# Patient Record
Sex: Female | Born: 1990 | Race: Black or African American | Hispanic: No | Marital: Single | Smoking: Former smoker
Health system: Southern US, Community
[De-identification: ages and names within clinical notes are randomized; demographics above are authoritative.]

## PROBLEM LIST (undated history)

## (undated) ENCOUNTER — Inpatient Hospital Stay (HOSPITAL_COMMUNITY): Payer: Self-pay

## (undated) DIAGNOSIS — R7303 Prediabetes: Secondary | ICD-10-CM

## (undated) DIAGNOSIS — N939 Abnormal uterine and vaginal bleeding, unspecified: Secondary | ICD-10-CM

## (undated) DIAGNOSIS — G932 Benign intracranial hypertension: Secondary | ICD-10-CM

## (undated) DIAGNOSIS — G43009 Migraine without aura, not intractable, without status migrainosus: Secondary | ICD-10-CM

## (undated) DIAGNOSIS — R51 Headache: Secondary | ICD-10-CM

## (undated) DIAGNOSIS — Z8619 Personal history of other infectious and parasitic diseases: Secondary | ICD-10-CM

## (undated) DIAGNOSIS — Z8279 Family history of other congenital malformations, deformations and chromosomal abnormalities: Secondary | ICD-10-CM

## (undated) DIAGNOSIS — R635 Abnormal weight gain: Secondary | ICD-10-CM

## (undated) DIAGNOSIS — Z8489 Family history of other specified conditions: Secondary | ICD-10-CM

## (undated) HISTORY — DX: Family history of other congenital malformations, deformations and chromosomal abnormalities: Z82.79

## (undated) HISTORY — PX: NO PAST SURGERIES: SHX2092

## (undated) HISTORY — DX: Benign intracranial hypertension: G93.2

## (undated) HISTORY — DX: Abnormal weight gain: R63.5

## (undated) HISTORY — DX: Headache: R51

---
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
## 2009-04-16 ENCOUNTER — Emergency Department (HOSPITAL_COMMUNITY): Admission: EM | Admit: 2009-04-16 | Discharge: 2009-04-17 | Payer: Self-pay | Admitting: Emergency Medicine

## 2009-07-27 DIAGNOSIS — G932 Benign intracranial hypertension: Secondary | ICD-10-CM

## 2009-07-27 HISTORY — DX: Benign intracranial hypertension: G93.2

## 2010-06-27 ENCOUNTER — Ambulatory Visit (HOSPITAL_COMMUNITY): Admission: RE | Admit: 2010-06-27 | Discharge: 2010-06-27 | Payer: Self-pay | Admitting: Neurology

## 2010-07-02 ENCOUNTER — Encounter: Admission: RE | Admit: 2010-07-02 | Discharge: 2010-07-02 | Payer: Self-pay | Admitting: Neurology

## 2011-01-09 LAB — CSF CELL COUNT WITH DIFFERENTIAL
Eosinophils, CSF: 0 % (ref 0–1)
Other Cells, CSF: 0

## 2011-01-09 LAB — PROTEIN AND GLUCOSE, CSF: Total  Protein, CSF: 16 mg/dL (ref 15–45)

## 2011-02-03 LAB — CSF CELL COUNT WITH DIFFERENTIAL
RBC Count, CSF: 0 /mm3
Tube #: 3
WBC, CSF: 1 /mm3 (ref 0–5)

## 2011-02-03 LAB — CSF CULTURE W GRAM STAIN
Culture: NO GROWTH
Gram Stain: NONE SEEN

## 2011-02-03 LAB — PROTEIN, CSF: Total  Protein, CSF: 12 mg/dL — ABNORMAL LOW (ref 15–45)

## 2011-02-03 LAB — GLUCOSE, CSF: Glucose, CSF: 63 mg/dL (ref 43–76)

## 2011-10-28 NOTE — L&D Delivery Note (Signed)
Delivery Note  I was notified of pt SROM w thick MSAF at 1235, mild early variables were present that resolved w position changes for a short time, early variables returned and Pt was then ant. Lip at 1345, FHR overall reassuring    I arrived to [facility] and pt pushed x1  At 2:20 PM a viable female was delivered via Vaginal, Spontaneous Delivery (Presentation: ; Occiput ).  APGAR: 8, 9; weight 6 lb 8 oz (2948 g).   Placenta status: Intact, Spontaneous.  Cord: 3 vessels with the following complications: None.  Cord pH: n/a NICU team was called, but infant was immediately vigorous, so team not needed   Anesthesia: Epidural  Episiotomy: None Lacerations: 1st degree;Labial Suture Repair: 3.0 vicryl rapide Est. Blood Loss (mL): 200  Mom to postpartum.  Baby to nursery-stable.  Daisy Werner 10/05/2012, 3:12 PM (late entry)

## 2012-03-17 ENCOUNTER — Ambulatory Visit (INDEPENDENT_AMBULATORY_CARE_PROVIDER_SITE_OTHER): Payer: BC Managed Care – PPO | Admitting: Obstetrics and Gynecology

## 2012-03-17 DIAGNOSIS — Z331 Pregnant state, incidental: Secondary | ICD-10-CM

## 2012-03-17 LAB — POCT URINALYSIS DIPSTICK
Bilirubin, UA: NEGATIVE
Blood, UA: NEGATIVE
Glucose, UA: NEGATIVE
Leukocytes, UA: NEGATIVE
Nitrite, UA: NEGATIVE
Urobilinogen, UA: NEGATIVE

## 2012-03-18 LAB — PRENATAL PANEL VII
Antibody Screen: NEGATIVE
Basophils Absolute: 0 10*3/uL (ref 0.0–0.1)
Eosinophils Relative: 4 % (ref 0–5)
HCT: 32.2 % — ABNORMAL LOW (ref 36.0–46.0)
HIV: NONREACTIVE
Hemoglobin: 10.6 g/dL — ABNORMAL LOW (ref 12.0–15.0)
Lymphocytes Relative: 14 % (ref 12–46)
Lymphs Abs: 1.4 10*3/uL (ref 0.7–4.0)
MCV: 85.9 fL (ref 78.0–100.0)
Monocytes Absolute: 1 10*3/uL (ref 0.1–1.0)
Monocytes Relative: 9 % (ref 3–12)
Neutro Abs: 7.4 10*3/uL (ref 1.7–7.7)
RDW: 13.2 % (ref 11.5–15.5)
Rh Type: POSITIVE
Rubella: 133.9 IU/mL — ABNORMAL HIGH
WBC: 10.2 10*3/uL (ref 4.0–10.5)

## 2012-03-19 LAB — CULTURE, OB URINE
Colony Count: NO GROWTH
Organism ID, Bacteria: NO GROWTH

## 2012-03-19 LAB — HEMOGLOBINOPATHY EVALUATION: Hgb F Quant: 0 % (ref 0.0–2.0)

## 2012-03-23 DIAGNOSIS — O99019 Anemia complicating pregnancy, unspecified trimester: Secondary | ICD-10-CM | POA: Insufficient documentation

## 2012-03-24 ENCOUNTER — Telehealth: Payer: Self-pay | Admitting: Obstetrics and Gynecology

## 2012-03-24 NOTE — Telephone Encounter (Signed)
Triage/epic 

## 2012-03-25 NOTE — Telephone Encounter (Signed)
Spoke with pt regarding 1st trimester screening. Patient wants 1st trimester screening. Informed pt to keep appt tomorrow & HS will order and schedule 1st trimester for her. Pt agrees and understands.

## 2012-03-25 NOTE — Telephone Encounter (Signed)
Lm for pt who wants 1st tri screening

## 2012-03-26 ENCOUNTER — Ambulatory Visit (INDEPENDENT_AMBULATORY_CARE_PROVIDER_SITE_OTHER): Payer: BC Managed Care – PPO

## 2012-03-26 VITALS — BP 130/54 | Ht 65.0 in | Wt 190.0 lb

## 2012-03-26 DIAGNOSIS — E669 Obesity, unspecified: Secondary | ICD-10-CM

## 2012-03-26 DIAGNOSIS — Z124 Encounter for screening for malignant neoplasm of cervix: Secondary | ICD-10-CM

## 2012-03-26 DIAGNOSIS — Z3400 Encounter for supervision of normal first pregnancy, unspecified trimester: Secondary | ICD-10-CM

## 2012-03-26 DIAGNOSIS — Z3401 Encounter for supervision of normal first pregnancy, first trimester: Secondary | ICD-10-CM

## 2012-03-26 DIAGNOSIS — Z8279 Family history of other congenital malformations, deformations and chromosomal abnormalities: Secondary | ICD-10-CM

## 2012-03-26 DIAGNOSIS — Z3480 Encounter for supervision of other normal pregnancy, unspecified trimester: Secondary | ICD-10-CM

## 2012-03-26 DIAGNOSIS — Z8619 Personal history of other infectious and parasitic diseases: Secondary | ICD-10-CM

## 2012-03-26 HISTORY — DX: Family history of other congenital malformations, deformations and chromosomal abnormalities: Z82.79

## 2012-03-26 NOTE — Progress Notes (Signed)
No concerns today  Last pap: 09/2010-WNL per pt Pt states grandmother just past Wants gentic testing

## 2012-03-26 NOTE — Patient Instructions (Signed)
ABCs of Pregnancy A Antepartum care is very important. Be sure you see your doctor and get prenatal care as soon as you think you are pregnant. At this time, you will be tested for infection, genetic abnormalities and potential problems with you and the pregnancy. This is the time to discuss diet, exercise, work, medications, labor, pain medication during labor and the possibility of a cesarean delivery. Ask any questions that may concern you. It is important to see your doctor regularly throughout your pregnancy. Avoid exposure to toxic substances and chemicals - such as cleaning solvents, lead and mercury, some insecticides, and paint. Pregnant women should avoid exposure to paint fumes, and fumes that cause you to feel ill, dizzy or faint. When possible, it is a good idea to have a pre-pregnancy consultation with your caregiver to begin some important recommendations your caregiver suggests such as, taking folic acid, exercising, quitting smoking, avoiding alcoholic beverages, etc. B Breastfeeding is the healthiest choice for both you and your baby. It has many nutritional benefits for the baby and health benefits for the mother. It also creates a very tight and loving bond between the baby and mother. Talk to your doctor, your family and friends, and your employer about how you choose to feed your baby and how they can support you in your decision. Not all birth defects can be prevented, but a woman can take actions that may increase her chance of having a healthy baby. Many birth defects happen very early in pregnancy, sometimes before a woman even knows she is pregnant. Birth defects or abnormalities of any child in your or the father's family should be discussed with your caregiver. Get a good support bra as your breast size changes. Wear it especially when you exercise and when nursing.  C Celebrate the news of your pregnancy with the your spouse/father and family. Childbirth classes are helpful to  take for you and the spouse/father because it helps to understand what happens during the pregnancy, labor and delivery. Cesarean delivery should be discussed with your doctor so you are prepared for that possibility. The pros and cons of circumcision if it is a boy, should be discussed with your pediatrician. Cigarette smoking during pregnancy can result in low birth weight babies. It has been associated with infertility, miscarriages, tubal pregnancies, infant death (mortality) and poor health (morbidity) in childhood. Additionally, cigarette smoking may cause long-term learning disabilities. If you smoke, you should try to quit before getting pregnant and not smoke during the pregnancy. Secondary smoke may also harm a mother and her developing baby. It is a good idea to ask people to stop smoking around you during your pregnancy and after the baby is born. Extra calcium is necessary when you are pregnant and is found in your prenatal vitamin, in dairy products, green leafy vegetables and in calcium supplements. D A healthy diet according to your current weight and height, along with vitamins and mineral supplements should be discussed with your caregiver. Domestic abuse or violence should be made known to your doctor right away to get the situation corrected. Drink more water when you exercise to keep hydrated. Discomfort of your back and legs usually develops and progresses from the middle of the second trimester through to delivery of the baby. This is because of the enlarging baby and uterus, which may also affect your balance. Do not take illegal drugs. Illegal drugs can seriously harm the baby and you. Drink extra fluids (water is best) throughout pregnancy to help   your body keep up with the increases in your blood volume. Drink at least 6 to 8 glasses of water, fruit juice, or milk each day. A good way to know you are drinking enough fluid is when your urine looks almost like clear water or is very light  yellow.  E Eat healthy to get the nutrients you and your unborn baby need. Your meals should include the five basic food groups. Exercise (30 minutes of light to moderate exercise a day) is important and encouraged during pregnancy, if there are no medical problems or problems with the pregnancy. Exercise that causes discomfort or dizziness should be stopped and reported to your caregiver. Emotions during pregnancy can change from being ecstatic to depression and should be understood by you, your partner and your family. F Fetal screening with ultrasound, amniocentesis and monitoring during pregnancy and labor is common and sometimes necessary. Take 400 micrograms of folic acid daily both before, when possible, and during the first few months of pregnancy to reduce the risk of birth defects of the brain and spine. All women who could possibly become pregnant should take a vitamin with folic acid, every day. It is also important to eat a healthy diet with fortified foods (enriched grain products, including cereals, rice, breads, and pastas) and foods with natural sources of folate (orange juice, green leafy vegetables, beans, peanuts, broccoli, asparagus, peas, and lentils). The father should be involved with all aspects of the pregnancy including, the prenatal care, childbirth classes, labor, delivery, and postpartum time. Fathers may also have emotional concerns about being a father, financial needs, and raising a family. G Genetic testing should be done appropriately. It is important to know your family and the father's history. If there have been problems with pregnancies or birth defects in your family, report these to your doctor. Also, genetic counselors can talk with you about the information you might need in making decisions about having a family. You can call a major medical center in your area for help in finding a board-certified genetic counselor. Genetic testing and counseling should be done  before pregnancy when possible, especially if there is a history of problems in the mother's or father's family. Certain ethnic backgrounds are more at risk for genetic defects. H Get familiar with the hospital where you will be having your baby. Get to know how long it takes to get there, the labor and delivery area, and the hospital procedures. Be sure your medical insurance is accepted there. Get your home ready for the baby including, clothes, the baby's room (when possible), furniture and car seat. Hand washing is important throughout the day, especially after handling raw meat and poultry, changing the baby's diaper or using the bathroom. This can help prevent the spread of many bacteria and viruses that cause infection. Your hair may become dry and thinner, but will return to normal a few weeks after the baby is born. Heartburn is a common problem that can be treated by taking antacids recommended by your caregiver, eating smaller meals 5 or 6 times a day, not drinking liquids when eating, drinking between meals and raising the head of your bed 2 to 3 inches. I Insurance to cover you, the baby, doctor and hospital should be reviewed so that you will be prepared to pay any costs not covered by your insurance plan. If you do not have medical insurance, there are usually clinics and services available for you in your community. Take 30 milligrams of iron during   your pregnancy as prescribed by your doctor to reduce the risk of low red blood cells (anemia) later in pregnancy. All women of childbearing age should eat a diet rich in iron. J There should be a joint effort for the mother, father and any other children to adapt to the pregnancy financially, emotionally, and psychologically during the pregnancy. Join a support group for moms-to-be. Or, join a class on parenting or childbirth. Have the family participate when possible. K Know your limits. Let your caregiver know if you experience any of the  following:   Pain of any kind.   Strong cramps.   You develop a lot of weight in a short period of time (5 pounds in 3 to 5 days).   Vaginal bleeding, leaking of amniotic fluid.   Headache, vision problems.   Dizziness, fainting, shortness of breath.   Chest pain.   Fever of 102 F (38.9 C) or higher.   Gush of clear fluid from your vagina.   Painful urination.   Domestic violence.   Irregular heartbeat (palpitations).   Rapid beating of the heart (tachycardia).   Constant feeling sick to your stomach (nauseous) and vomiting.   Trouble walking, fluid retention (edema).   Muscle weakness.   If your baby has decreased activity.   Persistent diarrhea.   Abnormal vaginal discharge.   Uterine contractions at 20-minute intervals.   Back pain that travels down your leg.  L Learn and practice that what you eat and drink should be in moderation and healthy for you and your baby. Legal drugs such as alcohol and caffeine are important issues for pregnant women. There is no safe amount of alcohol a woman can drink while pregnant. Fetal alcohol syndrome, a disorder characterized by growth retardation, facial abnormalities, and central nervous system dysfunction, is caused by a woman's use of alcohol during pregnancy. Caffeine, found in tea, coffee, soft drinks and chocolate, should also be limited. Be sure to read labels when trying to cut down on caffeine during pregnancy. More than 200 foods, beverages, and over-the-counter medications contain caffeine and have a high salt content! There are coffees and teas that do not contain caffeine. M Medical conditions such as diabetes, epilepsy, and high blood pressure should be treated and kept under control before pregnancy when possible, but especially during pregnancy. Ask your caregiver about any medications that may need to be changed or adjusted during pregnancy. If you are currently taking any medications, ask your caregiver if it  is safe to take them while you are pregnant or before getting pregnant when possible. Also, be sure to discuss any herbs or vitamins you are taking. They are medicines, too! Discuss with your doctor all medications, prescribed and over-the-counter, that you are taking. During your prenatal visit, discuss the medications your doctor may give you during labor and delivery. N Never be afraid to ask your doctor or caregiver questions about your health, the progress of the pregnancy, family problems, stressful situations, and recommendation for a pediatrician, if you do not have one. It is better to take all precautions and discuss any questions or concerns you may have during your office visits. It is a good idea to write down your questions before you visit the doctor. O Over-the-counter cough and cold remedies may contain alcohol or other ingredients that should be avoided during pregnancy. Ask your caregiver about prescription, herbs or over-the-counter medications that you are taking or may consider taking while pregnant.  P Physical activity during pregnancy can   benefit both you and your baby by lessening discomfort and fatigue, providing a sense of well-being, and increasing the likelihood of early recovery after delivery. Light to moderate exercise during pregnancy strengthens the belly (abdominal) and back muscles. This helps improve posture. Practicing yoga, walking, swimming, and cycling on a stationary bicycle are usually safe exercises for pregnant women. Avoid scuba diving, exercise at high altitudes (over 3000 feet), skiing, horseback riding, contact sports, etc. Always check with your doctor before beginning any kind of exercise, especially during pregnancy and especially if you did not exercise before getting pregnant. Q Queasiness, stomach upset and morning sickness are common during pregnancy. Eating a couple of crackers or dry toast before getting out of bed. Foods that you normally love may  make you feel sick to your stomach. You may need to substitute other nutritious foods. Eating 5 or 6 small meals a day instead of 3 large ones may make you feel better. Do not drink with your meals, drink between meals. Questions that you have should be written down and asked during your prenatal visits. R Read about and make plans to baby-proof your home. There are important tips for making your home a safer environment for your baby. Review the tips and make your home safer for you and your baby. Read food labels regarding calories, salt and fat content in the food. S Saunas, hot tubs, and steam rooms should be avoided while you are pregnant. Excessive high heat may be harmful during your pregnancy. Your caregiver will screen and examine you for sexually transmitted diseases and genetic disorders during your prenatal visits. Learn the signs of labor. Sexual relations while pregnant is safe unless there is a medical or pregnancy problem and your caregiver advises against it. T Traveling long distances should be avoided especially in the third trimester of your pregnancy. If you do have to travel out of state, be sure to take a copy of your medical records and medical insurance plan with you. You should not travel long distances without seeing your doctor first. Most airlines will not allow you to travel after 36 weeks of pregnancy. Toxoplasmosis is an infection caused by a parasite that can seriously harm an unborn baby. Avoid eating undercooked meat and handling cat litter. Be sure to wear gloves when gardening. Tingling of the hands and fingers is not unusual and is due to fluid retention. This will go away after the baby is born. U Womb (uterus) size increases during the first trimester. Your kidneys will begin to function more efficiently. This may cause you to feel the need to urinate more often. You may also leak urine when sneezing, coughing or laughing. This is due to the growing uterus pressing  against your bladder, which lies directly in front of and slightly under the uterus during the first few months of pregnancy. If you experience burning along with frequency of urination or bloody urine, be sure to tell your doctor. The size of your uterus in the third trimester may cause a problem with your balance. It is advisable to maintain good posture and avoid wearing high heels during this time. An ultrasound of your baby may be necessary during your pregnancy and is safe for you and your baby. V Vaccinations are an important concern for pregnant women. Get needed vaccines before pregnancy. Center for Disease Control (www.cdc.gov) has clear guidelines for the use of vaccines during pregnancy. Review the list, be sure to discuss it with your doctor. Prenatal vitamins are helpful   and healthy for you and the baby. Do not take extra vitamins except what is recommended. Taking too much of certain vitamins can cause overdose problems. Continuous vomiting should be reported to your caregiver. Varicose veins may appear especially if there is a family history of varicose veins. They should subside after the delivery of the baby. Support hose helps if there is leg discomfort. W Being overweight or underweight during pregnancy may cause problems. Try to get within 15 pounds of your ideal weight before pregnancy. Remember, pregnancy is not a time to be dieting! Do not stop eating or start skipping meals as your weight increases. Both you and your baby need the calories and nutrition you receive from a healthy diet. Be sure to consult with your doctor about your diet. There is a formula and diet plan available depending on whether you are overweight or underweight. Your caregiver or nutritionist can help and advise you if necessary. X Avoid X-rays. If you must have dental work or diagnostic tests, tell your dentist or physician that you are pregnant so that extra care can be taken. X-rays should only be taken when  the risks of not taking them outweigh the risk of taking them. If needed, only the minimum amount of radiation should be used. When X-rays are necessary, protective lead shields should be used to cover areas of the body that are not being X-rayed. Y Your baby loves you. Breastfeeding your baby creates a loving and very close bond between the two of you. Give your baby a healthy environment to live in while you are pregnant. Infants and children require constant care and guidance. Their health and safety should be carefully watched at all times. After the baby is born, rest or take a nap when the baby is sleeping. Z Get your ZZZs. Be sure to get plenty of rest. Resting on your side as often as possible, especially on your left side is advised. It provides the best circulation to your baby and helps reduce swelling. Try taking a nap for 30 to 45 minutes in the afternoon when possible. After the baby is born rest or take a nap when the baby is sleeping. Try elevating your feet for that amount of time when possible. It helps the circulation in your legs and helps reduce swelling.  Most information courtesy of the CDC. Document Released: 10/13/2005 Document Revised: 10/02/2011 Document Reviewed: 06/27/2009 [employer] Patient Information 2012 [employer]. 

## 2012-03-30 LAB — PAP IG, CT-NG, RFX HPV ASCU: GC Probe Amp: NEGATIVE

## 2012-04-01 ENCOUNTER — Ambulatory Visit (INDEPENDENT_AMBULATORY_CARE_PROVIDER_SITE_OTHER): Payer: BC Managed Care – PPO

## 2012-04-01 ENCOUNTER — Other Ambulatory Visit: Payer: Self-pay

## 2012-04-01 ENCOUNTER — Other Ambulatory Visit: Payer: Self-pay | Admitting: Obstetrics and Gynecology

## 2012-04-01 DIAGNOSIS — Z36 Encounter for antenatal screening of mother: Secondary | ICD-10-CM

## 2012-04-13 ENCOUNTER — Telehealth: Payer: Self-pay | Admitting: Obstetrics and Gynecology

## 2012-04-13 NOTE — Telephone Encounter (Signed)
Pt states she has been feeling lightheaded, breaking out in sweats, and having blurred vision. She states this has happened about 3 times in the past week. Pt states she has not had any bleeding or change in discharge. When asked what she ate today she states that she ate a lunchable, 2 hot pockets, and a candy bar today. Pt states that she has been drinking water today as well. Offered pt multiple appointments with Dr Stefano Gaul on Thursday but pt states she is unable to come in due to her work schedule. Advised pt to drink plenty of water, eat foods high in protein, and avoid foods high in salt. Also advised pt to call back if she starts to feel worse or any other changes.

## 2012-04-13 NOTE — Telephone Encounter (Signed)
Daisy Werner/pt in epic

## 2012-04-23 ENCOUNTER — Ambulatory Visit (INDEPENDENT_AMBULATORY_CARE_PROVIDER_SITE_OTHER): Payer: BC Managed Care – PPO | Admitting: Obstetrics and Gynecology

## 2012-04-23 ENCOUNTER — Encounter: Payer: Self-pay | Admitting: Obstetrics and Gynecology

## 2012-04-23 VITALS — BP 112/64 | Wt 193.0 lb

## 2012-04-23 DIAGNOSIS — Z331 Pregnant state, incidental: Secondary | ICD-10-CM

## 2012-04-23 NOTE — Progress Notes (Signed)
Pt c/o having vertigo 4 times in 3 weeks.

## 2012-04-23 NOTE — Progress Notes (Signed)
All labs normal. Ist trimester screen normal. AFP today. Ultrasound at next visit

## 2012-04-26 LAB — ALPHA FETOPROTEIN, MATERNAL
Curr Gest Age: 16.1 wks.days
MoM for AFP: 1.13
Open Spina bifida: NEGATIVE
Osb Risk: 1:17100 {titer}

## 2012-05-13 ENCOUNTER — Ambulatory Visit (INDEPENDENT_AMBULATORY_CARE_PROVIDER_SITE_OTHER): Payer: BC Managed Care – PPO | Admitting: Obstetrics and Gynecology

## 2012-05-13 ENCOUNTER — Ambulatory Visit (INDEPENDENT_AMBULATORY_CARE_PROVIDER_SITE_OTHER): Payer: BC Managed Care – PPO

## 2012-05-13 ENCOUNTER — Other Ambulatory Visit: Payer: Self-pay | Admitting: Obstetrics and Gynecology

## 2012-05-13 VITALS — BP 112/78 | Wt 195.0 lb

## 2012-05-13 DIAGNOSIS — G932 Benign intracranial hypertension: Secondary | ICD-10-CM

## 2012-05-13 DIAGNOSIS — Z331 Pregnant state, incidental: Secondary | ICD-10-CM

## 2012-05-13 DIAGNOSIS — Z3689 Encounter for other specified antenatal screening: Secondary | ICD-10-CM

## 2012-05-13 NOTE — Progress Notes (Signed)
Doing well. First trimester screen normal. Ultrasound: Single gestation, normal fluid, normal anatomy, cervix 3.66 cm, 18 weeks and 6 days (38 percentile), female Return to office in 4 weeks Diagnosed with pseudotumor cerebri.  Only occasional headaches. Dr. Stefano Gaul

## 2012-05-13 NOTE — Progress Notes (Signed)
Pt stated no issues today.  

## 2012-05-14 LAB — US OB COMP + 14 WK

## 2012-06-10 ENCOUNTER — Ambulatory Visit (INDEPENDENT_AMBULATORY_CARE_PROVIDER_SITE_OTHER): Payer: BC Managed Care – PPO | Admitting: Obstetrics and Gynecology

## 2012-06-10 ENCOUNTER — Encounter: Payer: Self-pay | Admitting: Obstetrics and Gynecology

## 2012-06-10 VITALS — BP 110/80 | Wt 204.0 lb

## 2012-06-10 DIAGNOSIS — M549 Dorsalgia, unspecified: Secondary | ICD-10-CM

## 2012-06-10 MED ORDER — CYCLOBENZAPRINE HCL 10 MG PO TABS: 10.0000 mg | ORAL_TABLET | Freq: Three times a day (TID) | ORAL | Status: DC | PRN

## 2012-06-10 NOTE — Progress Notes (Signed)
[redacted]w[redacted]d C/o back pain today. 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 Advised sitting on a swivel stool save her back. Flexeril 10mg s Q 8hrly PRN ordered for patient. Advised that it might make her sleepy. FM+ No change in vaginal secretions. ROB x 4 weeks.

## 2012-06-10 NOTE — Progress Notes (Signed)
C/o low back pain, neck, and shoulder pain  Pt wants to know how long after vag delivery can you drive? She's asking for her sister who vag delivered on Sat.  Pt fell on buttocks c/o R side pain, +FM, no bleeding, no leaking fluids

## 2012-07-05 ENCOUNTER — Ambulatory Visit (INDEPENDENT_AMBULATORY_CARE_PROVIDER_SITE_OTHER): Payer: BC Managed Care – PPO | Admitting: Obstetrics and Gynecology

## 2012-07-05 VITALS — BP 110/72 | Wt 211.0 lb

## 2012-07-05 DIAGNOSIS — Z331 Pregnant state, incidental: Secondary | ICD-10-CM

## 2012-07-05 NOTE — Progress Notes (Signed)
[redacted]w[redacted]d C/o back pain as had been given a stool at work with no back. Letter to employer for stool with back. No other problems No change in vaginal secretions. ROB 2 weeks

## 2012-07-05 NOTE — Progress Notes (Signed)
No concerns per pt  Pt wants to come next Monday morning for 1 gtt

## 2012-07-06 ENCOUNTER — Telehealth: Payer: Self-pay | Admitting: Obstetrics and Gynecology

## 2012-07-06 NOTE — Telephone Encounter (Signed)
Pt states she was seen by you yesterday and that she was given a note to be provided with a stool with back support at her job. Pt just wanted to let you know her job did not provide the correct stool because it was not in their inventory. Pt will discuss at her next appointment.

## 2012-07-06 NOTE — Telephone Encounter (Signed)
Lm on vm to call back

## 2012-07-12 ENCOUNTER — Other Ambulatory Visit: Payer: BC Managed Care – PPO

## 2012-07-12 DIAGNOSIS — Z331 Pregnant state, incidental: Secondary | ICD-10-CM

## 2012-07-12 LAB — HEMOGLOBIN: Hemoglobin: 10.6 g/dL — ABNORMAL LOW (ref 12.0–15.0)

## 2012-07-12 LAB — GLUCOSE TOLERANCE, 1 HOUR (50G) W/O FASTING: Glucose, 1 Hour GTT: 108 mg/dL (ref 70–140)

## 2012-07-13 LAB — RPR

## 2012-07-20 ENCOUNTER — Ambulatory Visit (INDEPENDENT_AMBULATORY_CARE_PROVIDER_SITE_OTHER): Payer: BC Managed Care – PPO | Admitting: Obstetrics and Gynecology

## 2012-07-20 ENCOUNTER — Encounter: Payer: Self-pay | Admitting: Obstetrics and Gynecology

## 2012-07-20 VITALS — BP 114/78 | Wt 218.0 lb

## 2012-07-20 DIAGNOSIS — Z331 Pregnant state, incidental: Secondary | ICD-10-CM

## 2012-07-20 NOTE — Progress Notes (Signed)
[redacted]w[redacted]d Glucola: 108.  Hemoglobin: 10.6.  RPR: Nonreactive. A positive.  Childbirth classes, preterm labor, contraception, breast-feeding, and car seats were reviewed. Back pain during pregnancy discussed.  Strategies reviewed.  The patient will try to get appropriate paperwork from her job. Return to office in 2 weeks. Dr. Stefano Gaul

## 2012-07-20 NOTE — Progress Notes (Signed)
Pt has no concerns today. 1 GTT Results: 108 Hemoglobin: 10.6 RPR: Non Reactive.  Pt needs paper work filled out to have a chair at work for back support.  Pt states it should have been faxed to our office.

## 2012-08-03 ENCOUNTER — Encounter: Payer: Self-pay | Admitting: Obstetrics and Gynecology

## 2012-08-03 ENCOUNTER — Ambulatory Visit (INDEPENDENT_AMBULATORY_CARE_PROVIDER_SITE_OTHER): Payer: BC Managed Care – PPO | Admitting: Obstetrics and Gynecology

## 2012-08-03 VITALS — BP 118/72 | Wt 220.0 lb

## 2012-08-03 DIAGNOSIS — Z23 Encounter for immunization: Secondary | ICD-10-CM

## 2012-08-03 DIAGNOSIS — Z331 Pregnant state, incidental: Secondary | ICD-10-CM

## 2012-08-03 NOTE — Addendum Note (Signed)
Addended by: Stephens Shire on: 08/03/2012 09:21 AM   Modules accepted: Orders

## 2012-08-03 NOTE — Progress Notes (Signed)
[redacted]w[redacted]d Doing okay.  That's still hurts.  Comfort measures reviewed. Flu shot today. Return to office in 2 weeks. Dr. Stefano Gaul

## 2012-08-03 NOTE — Progress Notes (Signed)
[redacted]w[redacted]d

## 2012-08-16 ENCOUNTER — Telehealth: Payer: Self-pay

## 2012-08-16 NOTE — Telephone Encounter (Signed)
Sent AVS a message re low hgb of 9.9 reported to [place] by Nurse from [facility] Dept. Melody Comas A

## 2012-08-16 NOTE — Telephone Encounter (Signed)
Took call from Nurse at [facility] dept who was reporting a low HGB on this pt of 9.9. Message will be forwarded to [facility]S. Levin Erp

## 2012-08-17 ENCOUNTER — Ambulatory Visit (INDEPENDENT_AMBULATORY_CARE_PROVIDER_SITE_OTHER): Payer: BC Managed Care – PPO | Admitting: Obstetrics and Gynecology

## 2012-08-17 ENCOUNTER — Encounter: Payer: Self-pay | Admitting: Obstetrics and Gynecology

## 2012-08-17 VITALS — BP 118/76 | Wt 224.0 lb

## 2012-08-17 DIAGNOSIS — Z331 Pregnant state, incidental: Secondary | ICD-10-CM

## 2012-08-17 MED ORDER — FERROUS SULFATE 325 (65 FE) MG PO TBEC: 325.0000 mg | DELAYED_RELEASE_TABLET | Freq: Every day | ORAL | Status: DC

## 2012-08-17 NOTE — Progress Notes (Signed)
Pt states went to [facility] office yesterday and hgb was 9.9

## 2012-08-17 NOTE — Patient Instructions (Signed)
Fetal Movement Counts Patient Name: __________________________________________________ Patient Due Date: ____________________ Kick counts is highly recommended in high risk pregnancies, but it is a good idea for every pregnant woman to do. Start counting fetal movements at 28 weeks of the pregnancy. Fetal movements increase after eating a full meal or eating or drinking something sweet (the blood sugar is higher). It is also important to drink plenty of fluids (well hydrated) before doing the count. Lie on your left side because it helps with the circulation or you can sit in a comfortable chair with your arms over your belly (abdomen) with no distractions around you. DOING THE COUNT  Try to do the count the same time of day each time you do it.  Mark the day and time, then see how long it takes for you to feel 10 movements (kicks, flutters, swishes, rolls). You should have at least 10 movements within 2 hours. You will most likely feel 10 movements in much less than 2 hours. If you do not, wait an hour and count again. After a couple of days you will see a pattern.  What you are looking for is a change in the pattern or not enough counts in 2 hours. Is it taking longer in time to reach 10 movements? SEEK MEDICAL CARE IF:  You feel less than 10 counts in 2 hours. Tried twice.  No movement in one hour.  The pattern is changing or taking longer each day to reach 10 counts in 2 hours.  You feel the baby is not moving as it usually does. Date: ____________ Movements: ____________ Start time: ____________ Finish time: ____________  Date: ____________ Movements: ____________ Start time: ____________ Finish time: ____________ Date: ____________ Movements: ____________ Start time: ____________ Finish time: ____________ Date: ____________ Movements: ____________ Start time: ____________ Finish time: ____________ Date: ____________ Movements: ____________ Start time: ____________ Finish time:  ____________ Date: ____________ Movements: ____________ Start time: ____________ Finish time: ____________ Date: ____________ Movements: ____________ Start time: ____________ Finish time: ____________ Date: ____________ Movements: ____________ Start time: ____________ Finish time: ____________  Date: ____________ Movements: ____________ Start time: ____________ Finish time: ____________ Date: ____________ Movements: ____________ Start time: ____________ Finish time: ____________ Date: ____________ Movements: ____________ Start time: ____________ Finish time: ____________ Date: ____________ Movements: ____________ Start time: ____________ Finish time: ____________ Date: ____________ Movements: ____________ Start time: ____________ Finish time: ____________ Date: ____________ Movements: ____________ Start time: ____________ Finish time: ____________ Date: ____________ Movements: ____________ Start time: ____________ Finish time: ____________  Date: ____________ Movements: ____________ Start time: ____________ Finish time: ____________ Date: ____________ Movements: ____________ Start time: ____________ Finish time: ____________ Date: ____________ Movements: ____________ Start time: ____________ Finish time: ____________ Date: ____________ Movements: ____________ Start time: ____________ Finish time: ____________ Date: ____________ Movements: ____________ Start time: ____________ Finish time: ____________ Date: ____________ Movements: ____________ Start time: ____________ Finish time: ____________ Date: ____________ Movements: ____________ Start time: ____________ Finish time: ____________  Date: ____________ Movements: ____________ Start time: ____________ Finish time: ____________ Date: ____________ Movements: ____________ Start time: ____________ Finish time: ____________ Date: ____________ Movements: ____________ Start time: ____________ Finish time: ____________ Date: ____________ Movements:  ____________ Start time: ____________ Finish time: ____________ Date: ____________ Movements: ____________ Start time: ____________ Finish time: ____________ Date: ____________ Movements: ____________ Start time: ____________ Finish time: ____________ Date: ____________ Movements: ____________ Start time: ____________ Finish time: ____________  Date: ____________ Movements: ____________ Start time: ____________ Finish time: ____________ Date: ____________ Movements: ____________ Start time: ____________ Finish time: ____________ Date: ____________ Movements: ____________ Start time: ____________ Finish time: ____________ Date: ____________ Movements:   ____________ Start time: ____________ Finish time: ____________ Date: ____________ Movements: ____________ Start time: ____________ Finish time: ____________ Date: ____________ Movements: ____________ Start time: ____________ Finish time: ____________ Date: ____________ Movements: ____________ Start time: ____________ Finish time: ____________  Date: ____________ Movements: ____________ Start time: ____________ Finish time: ____________ Date: ____________ Movements: ____________ Start time: ____________ Finish time: ____________ Date: ____________ Movements: ____________ Start time: ____________ Finish time: ____________ Date: ____________ Movements: ____________ Start time: ____________ Finish time: ____________ Date: ____________ Movements: ____________ Start time: ____________ Finish time: ____________ Date: ____________ Movements: ____________ Start time: ____________ Finish time: ____________ Date: ____________ Movements: ____________ Start time: ____________ Finish time: ____________  Date: ____________ Movements: ____________ Start time: ____________ Finish time: ____________ Date: ____________ Movements: ____________ Start time: ____________ Finish time: ____________ Date: ____________ Movements: ____________ Start time: ____________ Finish  time: ____________ Date: ____________ Movements: ____________ Start time: ____________ Finish time: ____________ Date: ____________ Movements: ____________ Start time: ____________ Finish time: ____________ Date: ____________ Movements: ____________ Start time: ____________ Finish time: ____________ Date: ____________ Movements: ____________ Start time: ____________ Finish time: ____________  Date: ____________ Movements: ____________ Start time: ____________ Finish time: ____________ Date: ____________ Movements: ____________ Start time: ____________ Finish time: ____________ Date: ____________ Movements: ____________ Start time: ____________ Finish time: ____________ Date: ____________ Movements: ____________ Start time: ____________ Finish time: ____________ Date: ____________ Movements: ____________ Start time: ____________ Finish time: ____________ Date: ____________ Movements: ____________ Start time: ____________ Finish time: ____________ Document Released: 11/12/2006 Document Revised: 01/05/2012 Document Reviewed: 05/15/2009 [employer] Patient Information 2013 [employer].  

## 2012-08-17 NOTE — Progress Notes (Signed)
Pt without c/o FKC reviewed RT 2 weeks Take iron QD.  Increase water intake and use stool softners prn

## 2012-08-21 ENCOUNTER — Telehealth: Payer: Self-pay | Admitting: Obstetrics and Gynecology

## 2012-08-21 NOTE — Telephone Encounter (Signed)
TC from patient--33 weeks, with small smear of pink mucus with single wipe after voiding.  No recurrence. No risk issues with pregnancy other than anemia and pseudotumor cerebri. No pain, leaking, other d/c.  Reports +FM. IC in last 48 hrs x 2. A+.  Will CTO at present.  Put pad on, observe. To call me back with update by 1pm. Anticipates going to work at 4pm--sits down at work.

## 2012-08-21 NOTE — Telephone Encounter (Signed)
TC from patient in f/u to previous TC today--called around 10 to report one smear of pink mucus on toilet tissue, but no bleeding or pain.  +FM.  33 weeks. IC within last 48 hours.   Was to have called back with update by 1pm--"fell asleep and just woke up". No bleeding, d/c, or pain.  +FM.  Will CTO at present--to call with any issues or concerns.

## 2012-08-26 ENCOUNTER — Telehealth: Payer: Self-pay | Admitting: Obstetrics and Gynecology

## 2012-08-26 NOTE — Telephone Encounter (Signed)
Tc to pt regarding msg.  Pt states was rx'd medication that helps with back spasms but it not helping with the pain and has tried Tylenol as well w/ minimal relief.  Pt says is drinking a lot of water. Pt denies any bldg or abnl d/c and does have + fm, is not having any issues with urination.  Pt scheduled for an appt tomorrow w/ SR @ 1310 for eval.

## 2012-08-27 ENCOUNTER — Encounter: Payer: Self-pay | Admitting: Obstetrics and Gynecology

## 2012-08-27 ENCOUNTER — Ambulatory Visit (INDEPENDENT_AMBULATORY_CARE_PROVIDER_SITE_OTHER): Payer: BC Managed Care – PPO | Admitting: Obstetrics and Gynecology

## 2012-08-27 VITALS — BP 118/64 | Temp 98.5°F | Wt 225.0 lb

## 2012-08-27 DIAGNOSIS — Z331 Pregnant state, incidental: Secondary | ICD-10-CM

## 2012-08-27 DIAGNOSIS — M549 Dorsalgia, unspecified: Secondary | ICD-10-CM

## 2012-08-27 LAB — POCT URINALYSIS DIPSTICK
Ketones, UA: NEGATIVE
Leukocytes, UA: NEGATIVE
pH, UA: 6

## 2012-08-27 NOTE — Progress Notes (Signed)
[redacted]w[redacted]d Pt c/o lower back pain. States that the pain starts on the left side and shoots to the right. Pt states feels like "stabbing" pain. Not noticed fever. States that when she gets the pain she feels that she is unable to move.

## 2012-08-27 NOTE — Progress Notes (Signed)
[redacted]w[redacted]d GFM Back pain non-responsive to exercise or local heat: will refer to [facility]

## 2012-08-31 ENCOUNTER — Encounter: Payer: BC Managed Care – PPO | Admitting: Obstetrics and Gynecology

## 2012-09-01 ENCOUNTER — Encounter: Payer: BC Managed Care – PPO | Admitting: Obstetrics and Gynecology

## 2012-09-02 ENCOUNTER — Telehealth: Payer: Self-pay | Admitting: Obstetrics and Gynecology

## 2012-09-02 ENCOUNTER — Inpatient Hospital Stay (HOSPITAL_COMMUNITY)
Admission: AD | Admit: 2012-09-02 | Discharge: 2012-09-02 | Disposition: A | Discharge status: Home / Self Care | Payer: BC Managed Care – PPO | Source: Ambulatory Visit | Attending: Obstetrics and Gynecology | Admitting: Obstetrics and Gynecology

## 2012-09-02 ENCOUNTER — Encounter (HOSPITAL_COMMUNITY): Payer: Self-pay

## 2012-09-02 ENCOUNTER — Other Ambulatory Visit: Payer: Self-pay | Admitting: Obstetrics and Gynecology

## 2012-09-02 DIAGNOSIS — M545 Low back pain, unspecified: Secondary | ICD-10-CM | POA: Insufficient documentation

## 2012-09-02 DIAGNOSIS — M259 Joint disorder, unspecified: Secondary | ICD-10-CM

## 2012-09-02 DIAGNOSIS — O99891 Other specified diseases and conditions complicating pregnancy: Secondary | ICD-10-CM | POA: Insufficient documentation

## 2012-09-02 DIAGNOSIS — O9989 Other specified diseases and conditions complicating pregnancy, childbirth and the puerperium: Secondary | ICD-10-CM

## 2012-09-02 DIAGNOSIS — M899 Disorder of bone, unspecified: Secondary | ICD-10-CM

## 2012-09-02 LAB — URINALYSIS, ROUTINE W REFLEX MICROSCOPIC
Bilirubin Urine: NEGATIVE
Nitrite: NEGATIVE
Specific Gravity, Urine: <1.005 — ABNORMAL LOW (ref 1.005–1.030)
Urobilinogen, UA: 0.2 mg/dL (ref 0.0–1.0)
pH: 6.5 (ref 5.0–8.0)

## 2012-09-02 LAB — URINE MICROSCOPIC-ADD ON

## 2012-09-02 MED ORDER — HYDROXYZINE HCL 50 MG PO TABS: 25.0000 mg | ORAL_TABLET | ORAL | Status: DC | PRN

## 2012-09-02 MED ORDER — HYDROXYZINE HCL 50 MG PO TABS
50.0000 mg | ORAL_TABLET | Freq: Once | ORAL | Status: AC
  Administered 2012-09-02: 50 mg via ORAL
  Filled 2012-09-02: qty 1

## 2012-09-02 NOTE — Telephone Encounter (Signed)
Lm on pt's voice mail to call back. 

## 2012-09-02 NOTE — MAU Note (Signed)
Pt states she had had lower back pain that is constant all day

## 2012-09-02 NOTE — Progress Notes (Signed)
History  Daisy Werner is a 21 y.o. G1P0 at [redacted]w[redacted]d   Chief Complaint  Patient presents with  . Back Pain  back pain throughout pregnancy, no headache, working at [employer] sits on stool, wants not out of work 11/08 if can not be out for pregnancy, not doing back exercise stopped while waiting for referral, plan for referral 1 week ago has not heard from [facility]. No relief with flexeril or tylenol. Had not tried heat, ice, or bath.   [redacted]w[redacted]d   Chief Complaint  Patient presents with  . Back Pain   @SFHPI @  Prior to Admission medications   Medication Sig Start Date End Date Taking? Authorizing Provider  acetaminophen (TYLENOL) 500 MG tablet Take 500 mg by mouth every 6 (six) hours as needed. Back pain   Yes Historical Provider, MD  albuterol (PROVENTIL HFA;VENTOLIN HFA) 108 (90 BASE) MCG/ACT inhaler Inhale 2 puffs into the lungs every 6 (six) hours as needed.   Yes Historical Provider, MD  ferrous sulfate 325 (65 FE) MG EC tablet Take 1 tablet (325 mg total) by mouth daily with breakfast. 08/17/12  Yes Naima A Dillard, MD  hydrOXYzine (ATARAX/VISTARIL) 50 MG tablet Take 0.5 tablets (25 mg total) by mouth every 4 (four) hours as needed for itching. 09/02/12   Lavera Guise, CNM    Patient Active Problem List  Diagnosis  . Anemia in pregnancy  . Pseudotumor cerebri   Vitals:  Blood pressure 123/73, pulse 105, temperature 98.2 F (36.8 C), temperature source Oral, resp. rate 18, height 5\' 5"  (1.651 m), weight 105.688 kg (233 lb), last menstrual period 01/02/2012, SpO2 99.00%. OB History    Grav Para Term Preterm Abortions TAB SAB Ect Mult Living   1               Past Medical History  Diagnosis Date  . Asthma     INHALER PRN;WEATHER INDUCED  . Pseudotumor cerebri 07/2009    HAS HAD 2 SPINAL TAPS AND BLOOD PATCH IN 2010 &9/ 2011  . Headache     MIGRAINES D/T PSEUDOTUMOR CEREBRI  . Weight gain     WT FLUCTUATES D/T PSEUDOTUMOR CEREBRI    Past Surgical History  Procedure Date  .  Wisdom tooth extraction 2007    ALL 4 EXTRACTED    Family History  Problem Relation Age of Onset  . Heart disease Paternal Grandmother     CHF  . Other Maternal Aunt     VARICOSE VEINS  . Other Maternal Grandmother     VARICOSE VEINS  . Seizures Cousin     MATERANL;EPILEPSY  . Seizures Maternal Aunt     MATERNAL  . Migraines Maternal Aunt   . Cancer Maternal Grandmother     BREAST TO BONES  . Hypertension Mother   . Hypertension Father   . Hypertension Maternal Aunt     2 AUNTS  . Hypertension Maternal Uncle     1 UNCLE  . Hypertension Paternal Aunt     4 AUNTS  . Hypertension Paternal Uncle     1 UNCLE  . Hypertension Brother   . Diabetes Father     ORAL MEDS  . Diabetes Brother     IDDM  . Stroke Paternal Grandmother   . Drug abuse Maternal Uncle   . Other Paternal Uncle     NICOTINE;MJ USE    History  Substance Use Topics  . Smoking status: Former Smoker    Types: Cigars  . Smokeless tobacco: Never Used  Comment: PT DID NOT SMOKE EVERY DAY  . Alcohol Use: No     Comment: SOCIAL DRINKER IN PAST    Allergies: No Known Allergies  Prescriptions prior to admission  Medication Sig Dispense Refill  . acetaminophen (TYLENOL) 500 MG tablet Take 500 mg by mouth every 6 (six) hours as needed. Back pain      . albuterol (PROVENTIL HFA;VENTOLIN HFA) 108 (90 BASE) MCG/ACT inhaler Inhale 2 puffs into the lungs every 6 (six) hours as needed.      . ferrous sulfate 325 (65 FE) MG EC tablet Take 1 tablet (325 mg total) by mouth daily with breakfast.  90 tablet  11  . [DISCONTINUED] cyclobenzaprine (FLEXERIL) 10 MG tablet Take 1 tablet (10 mg total) by mouth every 8 (eight) hours as needed for muscle spasms.  30 tablet  1  . [DISCONTINUED] Prenatal Vit-Fe Fumarate-FA (PRENATAL MULTIVITAMIN) TABS Take 1 tablet by mouth daily.      . [DISCONTINUED] PRENATAL VITAMINS PO Take 1 tablet by mouth daily.        @ROS @ Physical Exam   Blood pressure 123/73, pulse 105,  temperature 98.2 F (36.8 C), temperature source Oral, resp. rate 18, height 5\' 5"  (1.651 m), weight 105.688 kg (233 lb), last menstrual period 01/02/2012, SpO2 99.00%.  @PHYSEXAMBYAGE2 @ Labs:  Recent Results (from the past 24 hour(s))  URINALYSIS, ROUTINE W REFLEX MICROSCOPIC   Collection Time   09/02/12  8:45 PM      Component Value Range   Color, Urine YELLOW  YELLOW   APPearance CLEAR  CLEAR   Specific Gravity, Urine <1.005 (*) 1.005 - 1.030   pH 6.5  5.0 - 8.0   Glucose, UA NEGATIVE  NEGATIVE mg/dL   Hgb urine dipstick NEGATIVE  NEGATIVE   Bilirubin Urine NEGATIVE  NEGATIVE   Ketones, ur NEGATIVE  NEGATIVE mg/dL   Protein, ur NEGATIVE  NEGATIVE mg/dL   Urobilinogen, UA 0.2  0.0 - 1.0 mg/dL   Nitrite NEGATIVE  NEGATIVE   Leukocytes, UA TRACE (*) NEGATIVE  URINE MICROSCOPIC-ADD ON   Collection Time   09/02/12  8:45 PM      Component Value Range   Squamous Epithelial / LPF RARE  RARE   WBC, UA 0-2  <3 WBC/hpf   RBC / HPF 0-2  <3 RBC/hpf   Bacteria, UA RARE  RARE   ASSESSMENT: Patient Active Problem List  Diagnosis  . Anemia in pregnancy  . Pseudotumor cerebri   Physical Examination:  Physical exam: Calm, no distress, abd soft, gravid, nt,  No edema to lower extremities Neg CVAT bilaterally, points to low center back as painful and further down at times. Vag LTC FHTS NST reactive 140s UC without ED Course  Assessment/Plan [redacted]w[redacted]d back pain in pregnancy hx of pseudo tumor back exercise for referral [facility] call office in am. Discussed use of vistaril RX ,heat, ice, or bath. F/o as scheduled. Lavera Guise, CNM

## 2012-09-10 ENCOUNTER — Telehealth: Payer: Self-pay | Admitting: Obstetrics and Gynecology

## 2012-09-10 NOTE — Telephone Encounter (Signed)
Left msg on pt's vm to call back rgd msg. Pt never called back .  Pt stated in her msg she was having pain down her butt . i reviewed  pt's chart and seems she  was seen at [facility] on 09/02/2012. Pt has a app on 09/13/2012.

## 2012-09-13 ENCOUNTER — Ambulatory Visit (INDEPENDENT_AMBULATORY_CARE_PROVIDER_SITE_OTHER): Payer: BC Managed Care – PPO | Admitting: Obstetrics and Gynecology

## 2012-09-13 VITALS — BP 120/62 | Wt 232.0 lb

## 2012-09-13 DIAGNOSIS — Z331 Pregnant state, incidental: Secondary | ICD-10-CM

## 2012-09-13 NOTE — Progress Notes (Signed)
[redacted]w[redacted]d Back pain is better. Beta strep, GC, Chlamydia sent. Return office in one week. Dr. Stefano Gaul

## 2012-09-13 NOTE — Progress Notes (Signed)
Pt stated was having some sharp pains the other day. Pt stated no other issues today.

## 2012-09-14 LAB — GC/CHLAMYDIA PROBE AMP
CT Probe RNA: NEGATIVE
GC Probe RNA: NEGATIVE

## 2012-09-21 ENCOUNTER — Ambulatory Visit (INDEPENDENT_AMBULATORY_CARE_PROVIDER_SITE_OTHER): Payer: BC Managed Care – PPO | Admitting: Obstetrics and Gynecology

## 2012-09-21 ENCOUNTER — Encounter: Payer: Self-pay | Admitting: Obstetrics and Gynecology

## 2012-09-21 VITALS — BP 130/78 | Wt 237.0 lb

## 2012-09-21 DIAGNOSIS — Z331 Pregnant state, incidental: Secondary | ICD-10-CM

## 2012-09-21 NOTE — Progress Notes (Signed)
Pt requests cervix check.  

## 2012-09-21 NOTE — Progress Notes (Signed)
[redacted]w[redacted]d Doing well. Return office in 1 week. Beta strep, GC, Chlamydia all negative. Dr. Stefano Gaul

## 2012-09-28 ENCOUNTER — Ambulatory Visit (INDEPENDENT_AMBULATORY_CARE_PROVIDER_SITE_OTHER): Payer: BC Managed Care – PPO | Admitting: Obstetrics and Gynecology

## 2012-09-28 VITALS — BP 110/74 | Wt 239.0 lb

## 2012-09-28 DIAGNOSIS — D649 Anemia, unspecified: Secondary | ICD-10-CM

## 2012-09-28 DIAGNOSIS — J45909 Unspecified asthma, uncomplicated: Secondary | ICD-10-CM

## 2012-09-28 DIAGNOSIS — O99019 Anemia complicating pregnancy, unspecified trimester: Secondary | ICD-10-CM

## 2012-09-28 NOTE — Patient Instructions (Signed)
Contraception Choices  Birth control (contraception) can stop pregnancy from happening. Different types of birth control work in different ways. Some can:  · Make the mucus in the cervix thick. This makes it hard for sperm to get into the uterus.  · Thin the lining of the uterus. This makes it hard for an egg to attach to the wall of the uterus.  · Stop the ovaries from releasing an egg.  · Block the sperm from reaching the egg.  Certain types of surgery can stop pregnancy from happening. For women, the sugery closes the fallopian tubes (tubal ligation). For men, the surgery stops sperm from releasing during sex (vasectomy).  HORMONAL BIRTH CONTROL  Hormonal birth control stops pregnancy by putting hormones into your body. Types of birth control include:  · A small tube put under the skin of the upper arm (implant). The tube can stay in place for 3 years.  · Shots given every 3 months.  · Pills taken every day or once after sex (intercourse).  · Patches that are changed once a week.  · A ring put into the vagina (vaginal ring). The ring is left in place for 3 weeks and removed for 1 week. Then, a new ring is put in the vagina.  BARRIER BIRTH CONTROL   Barrier birth control blocks sperm from reaching the egg. Types of birth control include:   · A thin covering worn on the penis (female condom) during sex.  · A soft, loose covering put into the vagina (female condom) before sex.  · A rubber bowl that sits over the cervix (diaphragm). The bowl must be made for you. The bowl is put into the vagina before sex. The bowl is left in place for 6 to 8 hours after sex.  · A small, soft cup that fits over the cervix (cervical cap). The cup must be made for you. The cup can be left in place for 48 hours after sex.  · A sponge that is put into the vagina before sex.  · A chemical that kills or blocks sperm from getting into the cervix and uterus (spermicide). The chemical may be a cream, jelly, foam, or pill.  INTRAUTERINE (IUD)  BIRTH CONTROL   IUD birth control is a small, T-shaped piece of plastic. The plastic is put inside the uterus. There are 2 types of IUD:  · Copper IUD. The IUD is covered in copper wire. The copper makes a fluid that kills sperm. It can stay in place for 10 years.  · Hormone IUD. The hormone stops pregnancy from happening. It can stay in place for 5 years.  NATURAL FAMILY PLANNING BIRTH CONTROL   Natural family planning means not having sex or using barrier birth control when the woman is fertile. A woman can:  · Use a calendar to keep track of when she is fertile.  · Use a thermometer to measure her body temperature.  Protect yourself against sexual diseases no matter what type of birth control you use. Talk to your doctor about which type of birth control is best for you.  Document Released: 08/10/2009 Document Revised: 01/05/2012 Document Reviewed: 02/19/2011  Exit[employer]® Patient Information ©2013 [employer].

## 2012-09-28 NOTE — Progress Notes (Signed)
Pt stated unsure is she lost her mucous plug the other day . Pt wants a cervix check today/ pt stated no other issues .

## 2012-10-01 ENCOUNTER — Inpatient Hospital Stay (HOSPITAL_COMMUNITY)
Admission: AD | Admit: 2012-10-01 | Discharge: 2012-10-03 | DRG: 373 | Disposition: A | Discharge status: Home / Self Care | Payer: BC Managed Care – PPO | Source: Ambulatory Visit | Attending: Obstetrics and Gynecology | Admitting: Obstetrics and Gynecology

## 2012-10-01 ENCOUNTER — Inpatient Hospital Stay (HOSPITAL_COMMUNITY): Payer: BC Managed Care – PPO | Admitting: Anesthesiology

## 2012-10-01 ENCOUNTER — Encounter (HOSPITAL_COMMUNITY): Payer: Self-pay | Admitting: *Deleted

## 2012-10-01 ENCOUNTER — Encounter (HOSPITAL_COMMUNITY): Payer: Self-pay | Admitting: Anesthesiology

## 2012-10-01 DIAGNOSIS — O43899 Other placental disorders, unspecified trimester: Secondary | ICD-10-CM

## 2012-10-01 DIAGNOSIS — O368990 Maternal care for other specified fetal problems, unspecified trimester, not applicable or unspecified: Secondary | ICD-10-CM

## 2012-10-01 DIAGNOSIS — O99019 Anemia complicating pregnancy, unspecified trimester: Secondary | ICD-10-CM

## 2012-10-01 LAB — TYPE AND SCREEN: Antibody Screen: NEGATIVE

## 2012-10-01 LAB — CBC
MCH: 28.9 pg (ref 26.0–34.0)
Platelets: 257 10*3/uL (ref 150–400)
RBC: 3.87 MIL/uL (ref 3.87–5.11)
WBC: 8.8 10*3/uL (ref 4.0–10.5)

## 2012-10-01 LAB — RPR: RPR Ser Ql: NONREACTIVE

## 2012-10-01 MED ORDER — LACTATED RINGERS IV SOLN
INTRAVENOUS | Status: DC
  Administered 2012-10-01 (×2): via INTRAVENOUS

## 2012-10-01 MED ORDER — PRENATAL MULTIVITAMIN CH
1.0000 | ORAL_TABLET | Freq: Every day | ORAL | Status: DC
  Administered 2012-10-02 – 2012-10-03 (×2): 1 via ORAL
  Filled 2012-10-01 (×2): qty 1

## 2012-10-01 MED ORDER — ONDANSETRON HCL 4 MG/2ML IJ SOLN: 4.0000 mg | INTRAMUSCULAR | Status: DC | PRN

## 2012-10-01 MED ORDER — OXYTOCIN 40 UNITS IN LACTATED RINGERS INFUSION - SIMPLE MED
62.5000 mL/h | INTRAVENOUS | Status: DC
  Administered 2012-10-01: 62.5 mL/h via INTRAVENOUS

## 2012-10-01 MED ORDER — ALBUTEROL SULFATE HFA 108 (90 BASE) MCG/ACT IN AERS: 2.0000 | INHALATION_SPRAY | Freq: Four times a day (QID) | RESPIRATORY_TRACT | Status: DC | PRN

## 2012-10-01 MED ORDER — TETANUS-DIPHTH-ACELL PERTUSSIS 5-2.5-18.5 LF-MCG/0.5 IM SUSP
0.5000 mL | Freq: Once | INTRAMUSCULAR | Status: AC
  Administered 2012-10-02: 0.5 mL via INTRAMUSCULAR
  Filled 2012-10-01: qty 0.5

## 2012-10-01 MED ORDER — OXYCODONE-ACETAMINOPHEN 5-325 MG PO TABS
1.0000 | ORAL_TABLET | ORAL | Status: DC | PRN
  Administered 2012-10-01: 1 via ORAL
  Administered 2012-10-02: 2 via ORAL
  Filled 2012-10-01: qty 2
  Filled 2012-10-01: qty 1

## 2012-10-01 MED ORDER — ONDANSETRON HCL 4 MG PO TABS: 4.0000 mg | ORAL_TABLET | ORAL | Status: DC | PRN

## 2012-10-01 MED ORDER — OXYTOCIN BOLUS FROM INFUSION: 500.0000 mL | INTRAVENOUS | Status: DC

## 2012-10-01 MED ORDER — IBUPROFEN 600 MG PO TABS: 600.0000 mg | ORAL_TABLET | Freq: Four times a day (QID) | ORAL | Status: DC | PRN

## 2012-10-01 MED ORDER — WITCH HAZEL-GLYCERIN EX PADS: 1.0000 "application " | MEDICATED_PAD | CUTANEOUS | Status: DC | PRN

## 2012-10-01 MED ORDER — OXYCODONE-ACETAMINOPHEN 5-325 MG PO TABS: 1.0000 | ORAL_TABLET | ORAL | Status: DC | PRN

## 2012-10-01 MED ORDER — LIDOCAINE HCL (PF) 1 % IJ SOLN
INTRAMUSCULAR | Status: DC | PRN
  Administered 2012-10-01: 4 mL
  Administered 2012-10-01: 2 mL
  Administered 2012-10-01 (×2): 4 mL

## 2012-10-01 MED ORDER — DIPHENHYDRAMINE HCL 50 MG/ML IJ SOLN: 12.5000 mg | INTRAMUSCULAR | Status: DC | PRN

## 2012-10-01 MED ORDER — PHENYLEPHRINE 40 MCG/ML (10ML) SYRINGE FOR IV PUSH (FOR BLOOD PRESSURE SUPPORT): 80.0000 ug | PREFILLED_SYRINGE | INTRAVENOUS | Status: DC | PRN

## 2012-10-01 MED ORDER — CITRIC ACID-SODIUM CITRATE 334-500 MG/5ML PO SOLN: 30.0000 mL | ORAL | Status: DC | PRN

## 2012-10-01 MED ORDER — SENNOSIDES-DOCUSATE SODIUM 8.6-50 MG PO TABS
2.0000 | ORAL_TABLET | Freq: Every day | ORAL | Status: DC
  Administered 2012-10-01 – 2012-10-02 (×2): 2 via ORAL

## 2012-10-01 MED ORDER — ZOLPIDEM TARTRATE 5 MG PO TABS: 5.0000 mg | ORAL_TABLET | Freq: Every evening | ORAL | Status: DC | PRN

## 2012-10-01 MED ORDER — FENTANYL CITRATE 0.05 MG/ML IJ SOLN: 100.0000 ug | INTRAMUSCULAR | Status: DC | PRN

## 2012-10-01 MED ORDER — ACETAMINOPHEN 500 MG PO TABS: 1000.0000 mg | ORAL_TABLET | ORAL | Status: DC | PRN

## 2012-10-01 MED ORDER — EPHEDRINE 5 MG/ML INJ
10.0000 mg | INTRAVENOUS | Status: DC | PRN
  Filled 2012-10-01: qty 4

## 2012-10-01 MED ORDER — LACTATED RINGERS IV SOLN: 500.0000 mL | Freq: Once | INTRAVENOUS | Status: DC

## 2012-10-01 MED ORDER — ONDANSETRON HCL 4 MG/2ML IJ SOLN: 4.0000 mg | Freq: Four times a day (QID) | INTRAMUSCULAR | Status: DC | PRN

## 2012-10-01 MED ORDER — DIPHENHYDRAMINE HCL 25 MG PO CAPS: 25.0000 mg | ORAL_CAPSULE | Freq: Four times a day (QID) | ORAL | Status: DC | PRN

## 2012-10-01 MED ORDER — LACTATED RINGERS IV SOLN: 500.0000 mL | INTRAVENOUS | Status: DC | PRN

## 2012-10-01 MED ORDER — EPHEDRINE 5 MG/ML INJ: 10.0000 mg | INTRAVENOUS | Status: DC | PRN

## 2012-10-01 MED ORDER — LACTATED RINGERS IV SOLN: INTRAVENOUS | Status: DC

## 2012-10-01 MED ORDER — LANOLIN HYDROUS EX OINT: TOPICAL_OINTMENT | CUTANEOUS | Status: DC | PRN

## 2012-10-01 MED ORDER — BENZOCAINE-MENTHOL 20-0.5 % EX AERO
1.0000 "application " | INHALATION_SPRAY | CUTANEOUS | Status: DC | PRN
  Administered 2012-10-02: 1 via TOPICAL
  Filled 2012-10-01: qty 56

## 2012-10-01 MED ORDER — FLEET ENEMA 7-19 GM/118ML RE ENEM: 1.0000 | ENEMA | Freq: Every day | RECTAL | Status: DC | PRN

## 2012-10-01 MED ORDER — BISACODYL 10 MG RE SUPP: 10.0000 mg | Freq: Every day | RECTAL | Status: DC | PRN

## 2012-10-01 MED ORDER — PNEUMOCOCCAL VAC POLYVALENT 25 MCG/0.5ML IJ INJ
0.5000 mL | INJECTION | INTRAMUSCULAR | Status: DC
  Filled 2012-10-01: qty 0.5

## 2012-10-01 MED ORDER — LIDOCAINE HCL (PF) 1 % IJ SOLN
30.0000 mL | INTRAMUSCULAR | Status: DC | PRN
  Filled 2012-10-01: qty 30

## 2012-10-01 MED ORDER — FENTANYL 2.5 MCG/ML BUPIVACAINE 1/10 % EPIDURAL INFUSION (WH - ANES)
14.0000 mL/h | INTRAMUSCULAR | Status: DC
  Administered 2012-10-01: 14 mL/h via EPIDURAL
  Filled 2012-10-01: qty 125

## 2012-10-01 MED ORDER — PHENYLEPHRINE 40 MCG/ML (10ML) SYRINGE FOR IV PUSH (FOR BLOOD PRESSURE SUPPORT)
80.0000 ug | PREFILLED_SYRINGE | INTRAVENOUS | Status: DC | PRN
  Filled 2012-10-01: qty 5

## 2012-10-01 MED ORDER — SIMETHICONE 80 MG PO CHEW: 80.0000 mg | CHEWABLE_TABLET | ORAL | Status: DC | PRN

## 2012-10-01 MED ORDER — DIBUCAINE 1 % RE OINT
1.0000 "application " | TOPICAL_OINTMENT | RECTAL | Status: DC | PRN
  Filled 2012-10-01: qty 28

## 2012-10-01 MED ORDER — MEASLES, MUMPS & RUBELLA VAC ~~LOC~~ INJ
0.5000 mL | INJECTION | Freq: Once | SUBCUTANEOUS | Status: DC
  Filled 2012-10-01: qty 0.5

## 2012-10-01 MED ORDER — IBUPROFEN 600 MG PO TABS
600.0000 mg | ORAL_TABLET | Freq: Four times a day (QID) | ORAL | Status: DC
  Administered 2012-10-01 – 2012-10-03 (×8): 600 mg via ORAL
  Filled 2012-10-01 (×9): qty 1

## 2012-10-01 NOTE — H&P (Signed)
Daisy Werner is a 21 y.o. female presenting for onset of strong ctx since 5am. Denies LOF, some show, GFM. Vomiting recently.   HPI:    Maternal Medical History:  Reason for admission: Reason for admission: contractions.  Contractions: Onset was 3-5 hours ago.   Frequency: regular.   Duration is approximately 60 seconds.   Perceived severity is moderate.    Fetal activity: Perceived fetal activity is normal.   Last perceived fetal movement was within the past hour.    Prenatal complications: no prenatal complications   OB History    Grav Para Term Preterm Abortions TAB SAB Ect Mult Living   1              Past Medical History  Diagnosis Date  . Asthma     INHALER PRN;WEATHER INDUCED  . Pseudotumor cerebri 07/2009    HAS HAD 2 SPINAL TAPS AND BLOOD PATCH IN 2010 &9/ 2011  . Headache     MIGRAINES D/T PSEUDOTUMOR CEREBRI  . Weight gain     WT FLUCTUATES D/T PSEUDOTUMOR CEREBRI   Past Surgical History  Procedure Date  . Wisdom tooth extraction 2007    ALL 4 EXTRACTED   Family History: family history includes Cancer in her maternal grandmother; Diabetes in her brother and father; Drug abuse in her maternal uncle; Heart disease in her paternal grandmother; Hypertension in her brother, father, maternal aunt, maternal uncle, mother, paternal aunt, and paternal uncle; Migraines in her maternal aunt; Other in her maternal aunt, maternal grandmother, and paternal uncle; Seizures in her cousin and maternal aunt; and Stroke in her paternal grandmother. Social History:  reports that she has quit smoking. Her smoking use included Cigars. She has never used smokeless tobacco. She reports that she does not drink alcohol or use illicit drugs.   Prenatal Transfer Tool  Maternal Diabetes: No Genetic Screening: Normal Maternal Ultrasounds/Referrals: Normal Fetal Ultrasounds or other Referrals:  None Maternal Substance Abuse:  No Significant Maternal Medications:  None Significant  Maternal Lab Results:  Lab values include: Group B Strep negative Other Comments:  None  Review of Systems  Constitutional: Positive for diaphoresis.  Gastrointestinal: Positive for vomiting.  All other systems reviewed and are negative.    Dilation: 4.5 Effacement (%): 80 Station: -2 Exam by:: Dorrene German RN Blood pressure 133/78, pulse 104, temperature 97.8 F (36.6 C), temperature source Oral, resp. rate 22, last menstrual period 01/02/2012. Maternal Exam:  Uterine Assessment: Contraction strength is moderate.  Contraction duration is 60 seconds. Contraction frequency is regular.   Abdomen: Patient reports no abdominal tenderness. Fundal height is aga.   Estimated fetal weight is 7-8.   Fetal presentation: vertex  Introitus: Normal vulva. Normal vagina.  Ferning test: not done.   Cervix: Cervix evaluated by digital exam.   Per RN   Fetal Exam Fetal Monitor Review: Mode: ultrasound.   Baseline rate: 130.  Variability: moderate (6-25 bpm).   Pattern: accelerations present and no decelerations.    Fetal State Assessment: Category I - tracings are normal.     Physical Exam  Nursing note and vitals reviewed. Constitutional: She is oriented to person, place, and time. She appears well-developed and well-nourished. She appears distressed.       Anxious, difficulty coping, "I need epidural"  HENT:  Head: Normocephalic.  Eyes: Pupils are equal, round, and reactive to light.  Neck: Normal range of motion.  Cardiovascular: Normal rate, regular rhythm and normal heart sounds.   Respiratory: Effort normal and breath  sounds normal.  GI: Soft. Bowel sounds are normal.  Genitourinary: Vagina normal.  Musculoskeletal: Normal range of motion. She exhibits no edema.  Neurological: She is alert and oriented to person, place, and time. She has normal reflexes.  Skin: Skin is warm. She is diaphoretic.  Psychiatric: She has a normal mood and affect. Her behavior is normal.    Prenatal  labs: ABO, Rh: A/POS/-- (05/22 1603) Antibody: NEG (05/22 1603) Rubella: 133.9 (05/22 1603) RPR: NON REAC (09/16 0923)  HBsAg: NEGATIVE (05/22 1603)  HIV: NON REACTIVE (05/22 1603)  GBS: NEGATIVE (11/18 0925)  1hr gtt 108, hgb 10.6 RPR NR  GBS and cx neg  1st trim screen WNL   Assessment/Plan: IUP at 39wks Early/active labor GBS neg FHR reassuring  Admit to b.s per c/w Dr Su Hilt Routine L&D orders Epidural asap Fentanyl IVP PRN   Dottie Vaquerano M 10/01/2012, 7:47 AM

## 2012-10-01 NOTE — MAU Note (Signed)
uc's started this a.m. @ 0515, denies bleeding or LOF.

## 2012-10-01 NOTE — Anesthesia Procedure Notes (Signed)
Epidural Patient location during procedure: OB Start time: 10/01/2012 8:57 AM  Staffing Performed by: anesthesiologist   Preanesthetic Checklist Completed: patient identified, site marked, surgical consent, pre-op evaluation, timeout performed, IV checked, risks and benefits discussed and monitors and equipment checked  Epidural Patient position: sitting Prep: site prepped and draped and DuraPrep Patient monitoring: continuous pulse ox and blood pressure Approach: midline Injection technique: LOR air  Needle:  Needle type: Tuohy  Needle gauge: 17 G Needle length: 9 cm and 9 Needle insertion depth: 7 cm Catheter type: closed end flexible Catheter size: 19 Gauge Catheter at skin depth: 12 cm Test dose: negative  Assessment Events: blood not aspirated, injection not painful, no injection resistance, negative IV test and no paresthesia  Additional Notes Discussed risk of headache, infection, bleeding, nerve injury and failed or incomplete block.  Patient voices understanding and wishes to proceed. Placement on second attempt, likely by left paramedian approach.  Patient tolerated placement well. No apparent complications.  Jasmine December, MD Reason for block:procedure for pain

## 2012-10-01 NOTE — Anesthesia Preprocedure Evaluation (Signed)
Anesthesia Evaluation  Patient identified by MRN, date of birth, ID band Patient awake    Reviewed: Allergy & Precautions, H&P , NPO status , Patient's Chart, lab work & pertinent test results, reviewed documented beta blocker date and time   History of Anesthesia Complications Negative for: history of anesthetic complications  Airway Mallampati: III TM Distance: >3 FB Neck ROM: full    Dental  (+) Teeth Intact   Pulmonary asthma (rare inhaler use) ,  breath sounds clear to auscultation        Cardiovascular negative cardio ROS  Rhythm:regular Rate:Normal     Neuro/Psych  Headaches (pseudotumor cerebri), negative psych ROS   GI/Hepatic negative GI ROS, Neg liver ROS,   Endo/Other  Morbid obesity  Renal/GU negative Renal ROS     Musculoskeletal   Abdominal   Peds  Hematology  (+) anemia ,   Anesthesia Other Findings   Reproductive/Obstetrics (+) Pregnancy                           Anesthesia Physical Anesthesia Plan  ASA: III  Anesthesia Plan: Epidural   Post-op Pain Management:    Induction:   Airway Management Planned:   Additional Equipment:   Intra-op Plan:   Post-operative Plan:   Informed Consent: I have reviewed the patients History and Physical, chart, labs and discussed the procedure including the risks, benefits and alternatives for the proposed anesthesia with the patient or authorized representative who has indicated his/her understanding and acceptance.     Plan Discussed with:   Anesthesia Plan Comments:         Anesthesia Quick Evaluation

## 2012-10-02 ENCOUNTER — Encounter (HOSPITAL_COMMUNITY): Payer: Self-pay

## 2012-10-02 LAB — CBC
MCH: 29.5 pg (ref 26.0–34.0)
MCHC: 33.9 g/dL (ref 30.0–36.0)
MCV: 86.8 fL (ref 78.0–100.0)
Platelets: 243 10*3/uL (ref 150–400)
RDW: 13.4 % (ref 11.5–15.5)

## 2012-10-02 MED ORDER — MEASLES, MUMPS & RUBELLA VAC ~~LOC~~ INJ: 0.5000 mL | INJECTION | Freq: Once | SUBCUTANEOUS | Status: DC

## 2012-10-02 MED ORDER — PNEUMOCOCCAL VAC POLYVALENT 25 MCG/0.5ML IJ INJ
0.5000 mL | INJECTION | INTRAMUSCULAR | Status: AC
  Administered 2012-10-03: 0.5 mL via INTRAMUSCULAR
  Filled 2012-10-02: qty 0.5

## 2012-10-02 NOTE — Progress Notes (Signed)
Post Partum Day 1 Subjective: no complaints, up ad lib, voiding, tolerating PO and + flatus  Objective: Blood pressure 122/76, pulse 99, temperature 98.3 F (36.8 C), temperature source Oral, resp. rate 20, height 5\' 5"  (1.651 m), weight 239 lb (108.41 kg), last menstrual period 01/02/2012, SpO2 100.00%, unknown if currently breastfeeding.  Physical Exam:  General: alert and no distress Lochia: appropriate Uterine Fundus: firm DVT Evaluation: No evidence of DVT seen on physical exam. No cords or calf tenderness.   Basename 10/02/12 0512 10/01/12 0745  HGB 9.4* 11.2*  HCT 27.7* 33.4*    Assessment/Plan: Doing well Plan for discharge tomorrow Contraception - plans OCPs Bottlefeeding    LOS: 1 day   Werner,Daisy Corpening Y 10/02/2012, 12:25 PM

## 2012-10-03 MED ORDER — OXYCODONE-ACETAMINOPHEN 5-325 MG PO TABS: 1.0000 | ORAL_TABLET | ORAL | Status: DC | PRN

## 2012-10-03 MED ORDER — NORGESTIMATE-ETH ESTRADIOL 0.25-35 MG-MCG PO TABS: 1.0000 | ORAL_TABLET | Freq: Every day | ORAL | Status: DC

## 2012-10-03 MED ORDER — INTEGRA PLUS PO CAPS: 1.0000 | ORAL_CAPSULE | Freq: Every day | ORAL | Status: DC

## 2012-10-05 NOTE — Anesthesia Postprocedure Evaluation (Signed)
Patient stable following vaginal delivery.  

## 2012-10-07 ENCOUNTER — Encounter: Payer: BC Managed Care – PPO | Admitting: Obstetrics and Gynecology

## 2012-10-08 ENCOUNTER — Telehealth: Payer: Self-pay | Admitting: Obstetrics and Gynecology

## 2012-10-08 NOTE — Telephone Encounter (Signed)
TC to pt. States delivered 10/01/12. Is still having edema of feet. Has not improved since delivery. No headaches. No vision disturbances. Is drinking sufficient water. States has been up and around and rests in recliner. Explained may be due to fluid shift PP. Advised to elevate feet above heart level and increase rest.  To call if no improvement or sx increase. Pt verbalizes comprehension. Plan OK'd by SL.

## 2012-10-10 ENCOUNTER — Observation Stay (HOSPITAL_COMMUNITY)
Admission: AD | Admit: 2012-10-10 | Discharge: 2012-10-11 | Disposition: A | Discharge status: Home / Self Care | Payer: BC Managed Care – PPO | Source: Ambulatory Visit | Attending: Obstetrics and Gynecology | Admitting: Obstetrics and Gynecology

## 2012-10-10 ENCOUNTER — Encounter (HOSPITAL_COMMUNITY): Payer: Self-pay

## 2012-10-10 DIAGNOSIS — D649 Anemia, unspecified: Secondary | ICD-10-CM | POA: Insufficient documentation

## 2012-10-10 DIAGNOSIS — O1093 Unspecified pre-existing hypertension complicating the puerperium: Principal | ICD-10-CM | POA: Insufficient documentation

## 2012-10-10 DIAGNOSIS — O9081 Anemia of the puerperium: Secondary | ICD-10-CM | POA: Insufficient documentation

## 2012-10-10 DIAGNOSIS — G43909 Migraine, unspecified, not intractable, without status migrainosus: Secondary | ICD-10-CM | POA: Diagnosis present

## 2012-10-10 DIAGNOSIS — G932 Benign intracranial hypertension: Secondary | ICD-10-CM | POA: Insufficient documentation

## 2012-10-10 LAB — COMPREHENSIVE METABOLIC PANEL
Albumin: 3.1 g/dL — ABNORMAL LOW (ref 3.5–5.2)
BUN: 12 mg/dL (ref 6–23)
Chloride: 101 mEq/L (ref 96–112)
Creatinine, Ser: 0.78 mg/dL (ref 0.50–1.10)
GFR calc non Af Amer: >90 mL/min (ref >90)
Total Bilirubin: 0.2 mg/dL — ABNORMAL LOW (ref 0.3–1.2)

## 2012-10-10 LAB — URINALYSIS, ROUTINE W REFLEX MICROSCOPIC
Bilirubin Urine: NEGATIVE
Ketones, ur: NEGATIVE mg/dL
Nitrite: NEGATIVE
Urobilinogen, UA: 0.2 mg/dL (ref 0.0–1.0)
pH: 6 (ref 5.0–8.0)

## 2012-10-10 LAB — URINE MICROSCOPIC-ADD ON

## 2012-10-10 LAB — URIC ACID: Uric Acid, Serum: 7.4 mg/dL — ABNORMAL HIGH (ref 2.4–7.0)

## 2012-10-10 LAB — LACTATE DEHYDROGENASE: LDH: 304 U/L — ABNORMAL HIGH (ref 94–250)

## 2012-10-10 MED ORDER — OXYCODONE-ACETAMINOPHEN 5-325 MG PO TABS
2.0000 | ORAL_TABLET | Freq: Once | ORAL | Status: AC | PRN
  Administered 2012-10-10: 2 via ORAL
  Filled 2012-10-10: qty 2

## 2012-10-10 NOTE — Addendum Note (Signed)
Addendum  created 10/10/12 2245 by Dana Allan, MD   Modules edited:Inpatient Notes

## 2012-10-10 NOTE — MAU Note (Signed)
Pt reports she delivered 1 week ago and has had a headache x 2 days, states was told to report if she has passed any clots and states she passed clot 3 times over last week.

## 2012-10-10 NOTE — Consult Note (Signed)
Asked by [name], CNM to evaluate patient with complaint of headache.  Patient delivered via SVD on 10/01/12 with labor epidural placed uneventfully by me.  She now presents to MAU complaining of headache which started on 10/08/12.  She reports that she has had the headache when awake since then, sometimes improving with percocet, sometimes not.  It does not improve with lying down.  She does have photophobia.  When I came to evaluate her, she is lying in the dark and says she still has the headache.  She does not have any visual changes.  She describes the headache as "behind my eyes".  She reports that yesterday the headache was accompanied by nausea, but not today.  She denies any other neurologic abnormalities other than "tightness" in her right foot which she attributes to swelling that she says is improving since delivery.  Her PMH includes pseudotumor cerebri, for which she has had therapeutic spinal tap more than once, and on one occasion had a postdural puncture headache (PDPH) which required blood patch.  She says this current headache is "not as bad" as her pseudotumor headaches or her PDPH.    Her symptoms are not consistent with PDPH (HA started one week after epidural placement, is not positional and is "behind her eyes").  Explained PDPH symptoms and natural history to patient and family member.  Further evaluation to rule out other causes of headache including migraine, PIH, or headache from pseudotumor cerebri is needed.  Daisy December, MD Anesthesiology

## 2012-10-10 NOTE — Progress Notes (Signed)
History  Daisy Werner is a 21 y.o. G1P1001 at [redacted]w[redacted]d   Chief Complaint  Patient presents with  . Headache   Pt reports she delivered 1 week ago and has had a headache x 2 days, states was told to report if she has passed any clots and states she passed clot 3 times over last week. Denies visual spots or blurring, using motrin for headache and tried one percocet it got better but did not go away, ha started yesterday, is front to temples now just on R side with tension to shoulders, hx of migraines with relief with Excedrin in the past, feels better lying flat x 2-3 days. Hx of spinal tap and then leaking fluid had to have spinal tap. Large clot on Friday et sm one yesterday, bottle feeding plans to pump has not started yet. Sees Dr. Kimber Relic for eye appt seen amount 5 months pg and has f/o in March. Not seen by neurology since 2011.      SCVD 10/01/2012    Chief Complaint  Patient presents with  . Headache   @SFHPI @  Prior to Admission medications   Medication Sig Start Date End Date Taking? Authorizing Provider  acetaminophen (TYLENOL) 500 MG tablet Take 500 mg by mouth every 6 (six) hours as needed. Back pain   Yes Historical Provider, MD  albuterol (PROVENTIL HFA;VENTOLIN HFA) 108 (90 BASE) MCG/ACT inhaler Inhale 2 puffs into the lungs every 6 (six) hours as needed. For asthma   Yes Historical Provider, MD  FeFum-FePoly-FA-B Cmp-C-Biot (INTEGRA PLUS) CAPS Take 1 capsule by mouth daily. 10/03/12  Yes Malissa Hippo, CNM  ibuprofen (ADVIL,MOTRIN) 200 MG tablet Take 200 mg by mouth every 6 (six) hours as needed.   Yes Historical Provider, MD  oxyCODONE-acetaminophen (PERCOCET/ROXICET) 5-325 MG per tablet Take 1-2 tablets by mouth every 4 (four) hours as needed (moderate - severe pain). 10/03/12  Yes Malissa Hippo, CNM  Prenatal Vit-Fe Fumarate-FA (PRENATAL MULTIVITAMIN) TABS Take 1 tablet by mouth daily.   Yes Historical Provider, MD  hydrOXYzine (ATARAX/VISTARIL) 50 MG tablet  Take 25 mg by mouth every 4 (four) hours as needed. Pt says she takes for muscle spasms    Historical Provider, MD  norgestimate-ethinyl estradiol (ORTHO-CYCLEN, 28,) 0.25-35 MG-MCG tablet Take 1 tablet by mouth daily. 10/24/12   Malissa Hippo, CNM    Patient Active Problem List  Diagnosis  . Anemia in pregnancy  . Pseudotumor cerebri  . Asthma  . NSVD (normal spontaneous vaginal delivery)   Vitals:  Blood pressure 127/87, pulse 79, temperature 98.9 F (37.2 C), temperature source Oral, resp. rate 20, height 5\' 5"  (1.651 m), weight 225 lb (102.059 kg), last menstrual period 01/02/2012, SpO2 99.00%, currently breastfeeding. OB History    Grav Para Term Preterm Abortions TAB SAB Ect Mult Living   1 1 1  0 0 0 0 0 0 1      Past Medical History  Diagnosis Date  . Asthma     INHALER PRN;WEATHER INDUCED  . Pseudotumor cerebri 07/2009    HAS HAD 2 SPINAL TAPS AND BLOOD PATCH IN 2010 &9/ 2011  . Headache     MIGRAINES D/T PSEUDOTUMOR CEREBRI  . Weight gain     WT FLUCTUATES D/T PSEUDOTUMOR CEREBRI  . NSVD (normal spontaneous vaginal delivery) 10/01/2012    Past Surgical History  Procedure Date  . Wisdom tooth extraction 2007    ALL 4 EXTRACTED    Family History  Problem Relation Age of Onset  .  Heart disease Paternal Grandmother     CHF  . Other Maternal Aunt     VARICOSE VEINS  . Other Maternal Grandmother     VARICOSE VEINS  . Seizures Cousin     MATERANL;EPILEPSY  . Seizures Maternal Aunt     MATERNAL  . Migraines Maternal Aunt   . Cancer Maternal Grandmother     BREAST TO BONES  . Hypertension Mother   . Hypertension Father   . Hypertension Maternal Aunt     2 AUNTS  . Hypertension Maternal Uncle     1 UNCLE  . Hypertension Paternal Aunt     4 AUNTS  . Hypertension Paternal Uncle     1 UNCLE  . Hypertension Brother   . Diabetes Father     ORAL MEDS  . Diabetes Brother     IDDM  . Stroke Paternal Grandmother   . Drug abuse Maternal Uncle   . Other  Paternal Uncle     NICOTINE;MJ USE    History  Substance Use Topics  . Smoking status: Former Smoker    Types: Cigars  . Smokeless tobacco: Never Used     Comment: PT DID NOT SMOKE EVERY DAY  . Alcohol Use: No     Comment: SOCIAL DRINKER IN PAST    Allergies: No Known Allergies  Prescriptions prior to admission  Medication Sig Dispense Refill  . acetaminophen (TYLENOL) 500 MG tablet Take 500 mg by mouth every 6 (six) hours as needed. Back pain      . albuterol (PROVENTIL HFA;VENTOLIN HFA) 108 (90 BASE) MCG/ACT inhaler Inhale 2 puffs into the lungs every 6 (six) hours as needed. For asthma      . FeFum-FePoly-FA-B Cmp-C-Biot (INTEGRA PLUS) CAPS Take 1 capsule by mouth daily.  30 capsule  2  . ibuprofen (ADVIL,MOTRIN) 200 MG tablet Take 200 mg by mouth every 6 (six) hours as needed.      Marland Kitchen oxyCODONE-acetaminophen (PERCOCET/ROXICET) 5-325 MG per tablet Take 1-2 tablets by mouth every 4 (four) hours as needed (moderate - severe pain).  30 tablet  0  . Prenatal Vit-Fe Fumarate-FA (PRENATAL MULTIVITAMIN) TABS Take 1 tablet by mouth daily.      . hydrOXYzine (ATARAX/VISTARIL) 50 MG tablet Take 25 mg by mouth every 4 (four) hours as needed. Pt says she takes for muscle spasms      . norgestimate-ethinyl estradiol (ORTHO-CYCLEN, 28,) 0.25-35 MG-MCG tablet Take 1 tablet by mouth daily.  1 Package  11    @ROS @ Physical Exam   Blood pressure 127/87, pulse 79, temperature 98.9 F (37.2 C), temperature source Oral, resp. rate 20, height 5\' 5"  (1.651 m), weight 225 lb (102.059 kg), last menstrual period 01/02/2012, SpO2 99.00%, currently breastfeeding.  @PHYSEXAMBYAGE2 @ Labs:  No results found for this or any previous visit (from the past 24 hour(s)). ASSESSMENT: Patient Active Problem List  Diagnosis  . Anemia in pregnancy  . Pseudotumor cerebri  . Asthma  . NSVD (normal spontaneous vaginal delivery)   Physical Examination:  Physical exam: Calm, no distress, in semi fowlers position,  HEENT grossly wnl lungs clear bilaterally, AP RRR, abd soft,  nt, bowel sounds active, abdomen nontender fundus 13 week size DTR +1 bilaterally lower legs no clonus No edema to lower extremities No blood with fundal check with mini pad with scant serous to brown ting ED Course  Assessment/Plan 9 days PP Pseudotumor cerbri  Headache Normal involution 9 days PP PIH labs pending Collaboration with Dr. Pennie Rushing at [facility], Dr.  Rodman Pickle called et updated on pt complaint, assessment per telephone. Lavera Guise, CNM Pain level from 5 to a 4 now, [facility] labs reviewed with Dr. Lowella Dell will discuss with Dr. Rodman Pickle and call with a plan. Lavera Guise, CNM

## 2012-10-11 ENCOUNTER — Inpatient Hospital Stay (HOSPITAL_COMMUNITY): Payer: BC Managed Care – PPO

## 2012-10-11 DIAGNOSIS — G43909 Migraine, unspecified, not intractable, without status migrainosus: Secondary | ICD-10-CM | POA: Diagnosis present

## 2012-10-11 DIAGNOSIS — R51 Headache: Secondary | ICD-10-CM

## 2012-10-11 MED ORDER — BUTALBITAL-APAP-CAFFEINE 50-325-40 MG PO TABS
2.0000 | ORAL_TABLET | ORAL | Status: DC | PRN
  Administered 2012-10-11 (×2): 2 via ORAL
  Filled 2012-10-11 (×2): qty 2

## 2012-10-11 MED ORDER — BUTALBITAL-APAP-CAFFEINE 50-325-40 MG PO TABS
1.0000 | ORAL_TABLET | Freq: Once | ORAL | Status: AC | PRN
  Administered 2012-10-11: 2 via ORAL
  Filled 2012-10-11: qty 2

## 2012-10-11 MED ORDER — IBUPROFEN 600 MG PO TABS
600.0000 mg | ORAL_TABLET | Freq: Once | ORAL | Status: AC
  Administered 2012-10-11: 600 mg via ORAL
  Filled 2012-10-11: qty 1

## 2012-10-11 MED ORDER — OXYCODONE-ACETAMINOPHEN 5-325 MG PO TABS: 2.0000 | ORAL_TABLET | Freq: Four times a day (QID) | ORAL | Status: DC | PRN

## 2012-10-11 MED ORDER — BUTALBITAL-APAP-CAFFEINE 50-325-40 MG PO TABS: 2.0000 | ORAL_TABLET | ORAL | Status: DC | PRN

## 2012-10-11 MED ORDER — IBUPROFEN 600 MG PO TABS: 600.0000 mg | ORAL_TABLET | Freq: Four times a day (QID) | ORAL | Status: DC | PRN

## 2012-10-11 MED ORDER — ACETAZOLAMIDE 125 MG PO TABS: 250.0000 mg | ORAL_TABLET | Freq: Two times a day (BID) | ORAL | Status: DC

## 2012-10-11 NOTE — Consult Note (Signed)
NEURO HOSPITALIST CONSULT NOTE    Reason for Consult: 7-8 day HA  HPI:                                                                                                                                          Daisy Werner is an 21 y.o. female who has diagnosed Pseudotumor cerebri.  In the past she was on Acetazolamide BID and just prior to pregnancy she states her opthalmologist took her down to once a day "due to her ocular exam looking ok". During pregnancy she was taken of the medication.  She had no problems during pregnancy being off the medication. Patient gave birth and was doing well.  She was walking, doing chores, driving for about 6 days then noted a slight HA that was bilateral temporal and piercing in quality.  She does have some discomfort behind her eyes that will wax and wane.  Over 6 days the HA increased, she noted light sensitivity, and was admitted to [facility].  She states these are different than her prior HA associated with pseudotumor.  Those were pounding in quality, affected her vision and she had noted "wooshing" in her ears. She has taken percocet with some relief.  Since admission she has been given Fioricet which has kept the HA to a 2/10.  She denies any visual problems.   Past Medical History  Diagnosis Date  . Asthma     INHALER PRN;WEATHER INDUCED  . Pseudotumor cerebri 07/2009    HAS HAD 2 SPINAL TAPS AND BLOOD PATCH IN 2010 &9/ 2011  . Headache     MIGRAINES D/T PSEUDOTUMOR CEREBRI  . Weight gain     WT FLUCTUATES D/T PSEUDOTUMOR CEREBRI  . NSVD (normal spontaneous vaginal delivery) 10/01/2012    Past Surgical History  Procedure Date  . Wisdom tooth extraction 2007    ALL 4 EXTRACTED    Family History  Problem Relation Age of Onset  . Heart disease Paternal Grandmother     CHF  . Other Maternal Aunt     VARICOSE VEINS  . Other Maternal Grandmother     VARICOSE VEINS  . Seizures Cousin     MATERANL;EPILEPSY  . Seizures Maternal  Aunt     MATERNAL  . Migraines Maternal Aunt   . Cancer Maternal Grandmother     BREAST TO BONES  . Hypertension Mother   . Hypertension Father   . Hypertension Maternal Aunt     2 AUNTS  . Hypertension Maternal Uncle     1 UNCLE  . Hypertension Paternal Aunt     4 AUNTS  . Hypertension Paternal Uncle     1 UNCLE  . Hypertension Brother   . Diabetes Father     ORAL MEDS  . Diabetes  Brother     IDDM  . Stroke Paternal Grandmother   . Drug abuse Maternal Uncle   . Other Paternal Uncle     NICOTINE;MJ USE     Social History:  reports that she has quit smoking. Her smoking use included Cigars. She has never used smokeless tobacco. She reports that she does not drink alcohol or use illicit drugs.  No Known Allergies  MEDICATIONS:                                                                                                                     Current facility-administered medications:butalbital-acetaminophen-caffeine (FIORICET, ESGIC) 50-325-40 MG per tablet 2 tablet, 2 tablet, Oral, Q4H PRN, Esmeralda Arthur, MD, 2 tablet at 10/11/12 1227;  ibuprofen (ADVIL,MOTRIN) tablet 600 mg, 600 mg, Oral, Q6H PRN, Esmeralda Arthur, MD;  oxyCODONE-acetaminophen (PERCOCET/ROXICET) 5-325 MG per tablet 2 tablet, 2 tablet, Oral, Q6H PRN, Esmeralda Arthur, MD   Prior to Admission medications   Medication Sig Start Date End Date Taking? Authorizing Provider  acetaminophen (TYLENOL) 500 MG tablet Take 500 mg by mouth every 6 (six) hours as needed. Back pain   Yes Historical Provider, MD  acetaminophen-codeine (TYLENOL #3) 300-30 MG per tablet Take 1 tablet by mouth as needed.   Yes Historical Provider, MD  albuterol (PROVENTIL HFA;VENTOLIN HFA) 108 (90 BASE) MCG/ACT inhaler Inhale 2 puffs into the lungs every 6 (six) hours as needed. For asthma   Yes Historical Provider, MD  FeFum-FePoly-FA-B Cmp-C-Biot (INTEGRA PLUS) CAPS Take 1 capsule by mouth daily. 10/03/12  Yes Malissa Hippo, CNM  ibuprofen  (ADVIL,MOTRIN) 200 MG tablet Take 200 mg by mouth every 6 (six) hours as needed.   Yes Historical Provider, MD  oxyCODONE-acetaminophen (PERCOCET/ROXICET) 5-325 MG per tablet Take 1-2 tablets by mouth every 4 (four) hours as needed (moderate - severe pain). 10/03/12  Yes Malissa Hippo, CNM  Prenatal Vit-Fe Fumarate-FA (PRENATAL MULTIVITAMIN) TABS Take 1 tablet by mouth daily.   Yes Historical Provider, MD  hydrOXYzine (ATARAX/VISTARIL) 50 MG tablet Take 25 mg by mouth every 4 (four) hours as needed. Pt says she takes for muscle spasms    Historical Provider, MD  norgestimate-ethinyl estradiol (ORTHO-CYCLEN, 28,) 0.25-35 MG-MCG tablet Take 1 tablet by mouth daily. 10/24/12   Malissa Hippo, CNM    ROS:  History obtained from the patient  General ROS: negative for - chills, fatigue, fever, night sweats, weight gain or weight loss Psychological ROS: negative for - behavioral disorder, hallucinations, memory difficulties, mood swings or suicidal ideation Ophthalmic ROS: negative for - blurry vision, double vision, eye pain or loss of vision ENT ROS: negative for - epistaxis, nasal discharge, oral lesions, sore throat, tinnitus or vertigo Allergy and Immunology ROS: negative for - hives or itchy/watery eyes Hematological and Lymphatic ROS: negative for - bleeding problems, bruising or swollen lymph nodes Endocrine ROS: negative for - galactorrhea, hair pattern changes, polydipsia/polyuria or temperature intolerance Respiratory ROS: negative for - cough, hemoptysis, shortness of breath or wheezing Cardiovascular ROS: negative for - chest pain, dyspnea on exertion, edema or irregular heartbeat Gastrointestinal ROS: negative for - abdominal pain, diarrhea, hematemesis, nausea/vomiting or stool incontinence Genito-Urinary ROS: negative for - dysuria, hematuria,  incontinence or urinary frequency/urgency Musculoskeletal ROS: negative for - joint swelling or muscular weakness Neurological ROS: as noted in HPI Dermatological ROS: negative for rash and skin lesion changes   Blood pressure 117/80, pulse 81, temperature 99.1 F (37.3 C), temperature source Oral, resp. rate 18, height 5\' 5"  (1.651 m), weight 102.059 kg (225 lb), last menstrual period 01/02/2012, SpO2 98.00%, currently breastfeeding.   Neurologic Examination:                                                                                                      Mental Status: Alert, oriented, thought content appropriate.  Speech fluent without evidence of aphasia.  Able to follow 3 step commands without difficulty. Cranial Nerves: II: Discs flat bilaterally; Visual fields grossly normal, pupils equal, round, reactive to light and accommodation III,IV, VI: ptosis not present, extra-ocular motions intact bilaterally V,VII: smile symmetric, facial light touch sensation normal bilaterally VIII: hearing normal bilaterally IX,X: gag reflex present XI: bilateral shoulder shrug XII: midline tongue extension Motor: Right : Upper extremity   5/5    Left:     Upper extremity   5/5  Lower extremity   5/5     Lower extremity   5/5 Tone and bulk:normal tone throughout; no atrophy noted Sensory: Pinprick and light touch intact throughout, bilaterally Deep Tendon Reflexes: 2+ and symmetric throughout Plantars: Right: downgoing   Left: downgoing Cerebellar: normal finger-to-nose,  normal heel-to-shin test CV: pulses palpable throughout     No results found for this basename: cbc, bmp, coags, chol, tri, ldl, hga1c    Results for orders placed during the hospital encounter of 10/10/12 (from the past 48 hour(s))  URINALYSIS, ROUTINE W REFLEX MICROSCOPIC     Status: Abnormal   Collection Time   10/10/12 11:00 PM      Component Value Range Comment   Color, Urine YELLOW  YELLOW    APPearance CLEAR   CLEAR    Specific Gravity, Urine 1.010  1.005 - 1.030    pH 6.0  5.0 - 8.0    Glucose, UA NEGATIVE  NEGATIVE mg/dL    Hgb urine dipstick LARGE (*) NEGATIVE    Bilirubin Urine NEGATIVE  NEGATIVE  Ketones, ur NEGATIVE  NEGATIVE mg/dL    Protein, ur NEGATIVE  NEGATIVE mg/dL    Urobilinogen, UA 0.2  0.0 - 1.0 mg/dL    Nitrite NEGATIVE  NEGATIVE    Leukocytes, UA MODERATE (*) NEGATIVE   URINE MICROSCOPIC-ADD ON     Status: Abnormal   Collection Time   10/10/12 11:00 PM      Component Value Range Comment   Squamous Epithelial / LPF FEW (*) RARE    WBC, UA 7-10  <3 WBC/hpf    RBC / HPF 11-20  <3 RBC/hpf    Bacteria, UA FEW (*) RARE   COMPREHENSIVE METABOLIC PANEL     Status: Abnormal   Collection Time   10/10/12 11:17 PM      Component Value Range Comment   Sodium 135  135 - 145 mEq/L    Potassium 3.7  3.5 - 5.1 mEq/L    Chloride 101  96 - 112 mEq/L    CO2 24  19 - 32 mEq/L    Glucose, Bld 86  70 - 99 mg/dL    BUN 12  6 - 23 mg/dL    Creatinine, Ser 6.64  0.50 - 1.10 mg/dL    Calcium 8.9  8.4 - 40.3 mg/dL    Total Protein 6.5  6.0 - 8.3 g/dL    Albumin 3.1 (*) 3.5 - 5.2 g/dL    AST 21  0 - 37 U/L    ALT 19  0 - 35 U/L    Alkaline Phosphatase 46  39 - 117 U/L    Total Bilirubin 0.2 (*) 0.3 - 1.2 mg/dL    GFR calc non Af Amer >90  >90 mL/min    GFR calc Af Amer >90  >90 mL/min   URIC ACID     Status: Abnormal   Collection Time   10/10/12 11:17 PM      Component Value Range Comment   Uric Acid, Serum 7.4 (*) 2.4 - 7.0 mg/dL   LACTATE DEHYDROGENASE     Status: Abnormal   Collection Time   10/10/12 11:17 PM      Component Value Range Comment   LDH 304 (*) 94 - 250 U/L     No results found.   Assessment/Plan:  21 YO female with 6-7 day onset of HA.  At worst 6/10 now rated 2/10 after Fioricet. Patient does have history of Pseudotumor cerebri which she states she was on Acetazolamide prior to getting pregnant but was then taken off for pregnancy.  Currently she has no  visual deficits, no oculomotor deficits, HA is rated 2/10 and patient is comfortable. Neurology exam is non-focal.   Recommend: 1) Continue Fioricet for pain control PRN 2) Restart her Acetazolamide at home dose prior to pregnancy. 3) Follow up with neurologist as out patient in 1-2 weeks.    Felicie Morn PA-C [facility] [phone]  I personally produced with this patient's evaluation and management improved the above assessment and treatment recommendations.  Venetia Maxon M.D. [facility] [phone] 10/11/2012, 12:53 PM

## 2012-10-11 NOTE — Discharge Summary (Signed)
Physician Discharge Summary  Patient ID: Daisy Werner MRN: [id number] DOB/AGE: 1991-02-05 21 y.o.  Admit date: 10/10/2012 Discharge date: 10/11/2012  Admission Diagnoses:  Headache, pseudotumor cerebri, SVB 10/01/12.   Discharge Diagnoses:  Active Problems:  Migraines   Discharged Condition: stable  Hospital Course: Admitted 10/10/12 c/o HAs x 2 days.  Hx pseudotumor cerebri, on Acetazolamide prior to pregnancy.  Had PIH w/u, with normal labs.  Consultation with [facility]--normal CT of head.  They recommended d/c home with Rx for Fioricet and restart of Acetazolomide 250 mg po BID (dose regimen prior to pregnancy).  Advised patient breastfeeding is not recommended with Acetazolomide use, so patient is electing to continue plan for bottlefeeding.  Patient is to f/u with [facility] in 1 week ([facility] will call to schedule) and with [facility] as scheduled for 6 week pp visit.  Consults: neurology, with [facility]  Significant Diagnostic Studies: labs: [facility] w/u and radiology: CT scan: Of head WNL:  Treatments: IV hydration and analgesia: Fioricet  Discharge Exam: Filed Vitals:   10/11/12 0223 10/11/12 0630 10/11/12 1200 10/11/12 1742  BP: 123/80 115/77 117/80 138/90  Pulse:  74 81 84  Temp:  98.3 F (36.8 C) 99.1 F (37.3 C) 98.2 F (36.8 C)  TempSrc:  Oral Oral Oral  Resp:  17 18 18   Height:      Weight:      SpO2:  99% 98% 99%    General appearance: alert Resp: clear to auscultation bilaterally Breasts: normal appearance, no masses or tenderness Cardio: regular rate and rhythm, S1, S2 normal, no murmur, click, rub or gallop GI: soft, non-tender; bowel sounds normal; no masses,  no organomegaly Pelvic: deferred Extremities: extremities normal, atraumatic, no cyanosis or edema  Disposition: 01-Home or Self Care  Discharge Orders    Future Appointments: Provider: Department: Dept Phone: Center:   11/12/2012 9:30 AM Malissa Hippo, [facility] [phone] None       Medication List     As of 10/11/2012  6:02 PM    TAKE these medications         acetaminophen 500 MG tablet   Commonly known as: TYLENOL   Take 500 mg by mouth every 6 (six) hours as needed. Back pain      acetaminophen-codeine 300-30 MG per tablet   Commonly known as: TYLENOL #3   Take 1 tablet by mouth as needed.      acetaZOLAMIDE 125 MG tablet   Commonly known as: DIAMOX   Take 2 tablets (250 mg total) by mouth 2 (two) times daily.      albuterol 108 (90 BASE) MCG/ACT inhaler   Commonly known as: PROVENTIL HFA;VENTOLIN HFA   Inhale 2 puffs into the lungs every 6 (six) hours as needed. For asthma      butalbital-acetaminophen-caffeine 50-325-40 MG per tablet   Commonly known as: FIORICET, ESGIC   Take 2 tablets by mouth every 4 (four) hours as needed for headache.      hydrOXYzine 50 MG tablet   Commonly known as: ATARAX/VISTARIL   Take 25 mg by mouth every 4 (four) hours as needed. Pt says she takes for muscle spasms      ibuprofen 200 MG tablet   Commonly known as: ADVIL,MOTRIN   Take 200 mg by mouth every 6 (six) hours as needed.      INTEGRA PLUS Caps   Take 1 capsule by mouth daily.      norgestimate-ethinyl estradiol 0.25-35 MG-MCG  tablet   Commonly known as: ORTHO-CYCLEN,SPRINTEC,PREVIFEM   Take 1 tablet by mouth daily.   Start taking on: 10/24/2012      oxyCODONE-acetaminophen 5-325 MG per tablet   Commonly known as: PERCOCET/ROXICET   Take 1-2 tablets by mouth every 4 (four) hours as needed (moderate - severe pain).      prenatal multivitamin Tabs   Take 1 tablet by mouth daily.           Follow-up Information    Follow up with [facility]. Schedule an appointment as soon as possible for a visit in 1 week. ([facility] will call and get appt scheduled for you for 1 week f/u with [facility])    Contact information:   [address] [name]  [address] [phone]         Signed: Nigel Bridgeman 10/11/2012, 6:02 PM

## 2012-10-11 NOTE — H&P (Signed)
Daisy Werner is a 21 y.o. G1P1001 at [redacted]w[redacted]d  Chief Complaint   Patient presents with   .  Headache    Pt reports she delivered 1 week ago and has had a headache x 2 days, states was told to report if she has passed any clots and states she passed clot 3 times over last week. Denies visual spots or blurring, using motrin for headache and tried one percocet it got better but did not go away, ha started yesterday, is front to temples now just on R side with tension to shoulders, hx of migraines with relief with Excedrin in the past, feels better lying flat x 2-3 days. Hx of spinal tap and then leaking fluid had to have clot placed, I had a epidural for labor. Large clot on Friday et sm one yesterday, bottle feeding plans to pump has not started yet. Sees Dr. Kimber Relic for eye appt seen amount 5 months pg and has f/o in March. Not seen by neurology since 2011.      SCVD 10/01/2012  Chief Complaint   Patient presents with   .  Headache    @SFHPI @  Prior to Admission medications   Medication  Sig  Start Date  End Date  Taking?  Authorizing Provider   acetaminophen (TYLENOL) 500 MG tablet  Take 500 mg by mouth every 6 (six) hours as needed. Back pain    Yes  Historical Provider, MD   albuterol (PROVENTIL HFA;VENTOLIN HFA) 108 (90 BASE) MCG/ACT inhaler  Inhale 2 puffs into the lungs every 6 (six) hours as needed. For asthma    Yes  Historical Provider, MD   FeFum-FePoly-FA-B Cmp-C-Biot (INTEGRA PLUS) CAPS  Take 1 capsule by mouth daily.  10/03/12   Yes  Malissa Hippo, CNM   ibuprofen (ADVIL,MOTRIN) 200 MG tablet  Take 200 mg by mouth every 6 (six) hours as needed.    Yes  Historical Provider, MD   oxyCODONE-acetaminophen (PERCOCET/ROXICET) 5-325 MG per tablet  Take 1-2 tablets by mouth every 4 (four) hours as needed (moderate - severe pain).  10/03/12   Yes  Malissa Hippo, CNM   Prenatal Vit-Fe Fumarate-FA (PRENATAL MULTIVITAMIN) TABS  Take 1 tablet by mouth daily.    Yes  Historical  Provider, MD   hydrOXYzine (ATARAX/VISTARIL) 50 MG tablet  Take 25 mg by mouth every 4 (four) hours as needed. Pt says she takes for muscle spasms     Historical Provider, MD   norgestimate-ethinyl estradiol (ORTHO-CYCLEN, 28,) 0.25-35 MG-MCG tablet  Take 1 tablet by mouth daily.  10/24/12    Malissa Hippo, CNM    Patient Active Problem List   Diagnosis   .  Anemia in pregnancy   .  Pseudotumor cerebri   .  Asthma   .  NSVD (normal spontaneous vaginal delivery)    Vitals: Blood pressure 127/87, pulse 79, temperature 98.9 F (37.2 C), temperature source Oral, resp. rate 20, height 5\' 5"  (1.651 m), weight 225 lb (102.059 kg), last menstrual period 01/02/2012, SpO2 99.00%, currently breastfeeding.  OB History    Grav  Para  Term  Preterm  Abortions  TAB  SAB  Ect  Mult  Living    1  1  1   0  0  0  0  0  0  1      Past Medical History   Diagnosis  Date   .  Asthma      INHALER PRN;WEATHER INDUCED   .  Pseudotumor cerebri  07/2009     HAS HAD 2 SPINAL TAPS AND BLOOD PATCH IN 2010 &9/ 2011   .  Headache      MIGRAINES D/T PSEUDOTUMOR CEREBRI   .  Weight gain      WT FLUCTUATES D/T PSEUDOTUMOR CEREBRI   .  NSVD (normal spontaneous vaginal delivery)  10/01/2012    Past Surgical History   Procedure  Date   .  Wisdom tooth extraction  2007     ALL 4 EXTRACTED    Family History   Problem  Relation  Age of Onset   .  Heart disease  Paternal Grandmother       CHF    .  Other  Maternal Aunt       VARICOSE VEINS    .  Other  Maternal Grandmother       VARICOSE VEINS    .  Seizures  Cousin       MATERANL;EPILEPSY    .  Seizures  Maternal Aunt       MATERNAL    .  Migraines  Maternal Aunt    .  Cancer  Maternal Grandmother       BREAST TO BONES    .  Hypertension  Mother    .  Hypertension  Father    .  Hypertension  Maternal Aunt       2 AUNTS    .  Hypertension  Maternal Uncle       1 UNCLE    .  Hypertension  Paternal Aunt       4 AUNTS    .   Hypertension  Paternal Uncle       1 UNCLE    .  Hypertension  Brother    .  Diabetes  Father       ORAL MEDS    .  Diabetes  Brother       IDDM    .  Stroke  Paternal Grandmother    .  Drug abuse  Maternal Uncle    .  Other  Paternal Uncle       NICOTINE;MJ USE    History   Substance Use Topics   .  Smoking status:  Former Smoker     Types:  Cigars   .  Smokeless tobacco:  Never Used      Comment: PT DID NOT SMOKE EVERY DAY   .  Alcohol Use:  No      Comment: SOCIAL DRINKER IN PAST    Allergies: No Known Allergies  Prescriptions prior to admission   Medication  Sig  Dispense  Refill   .  acetaminophen (TYLENOL) 500 MG tablet  Take 500 mg by mouth every 6 (six) hours as needed. Back pain     .  albuterol (PROVENTIL HFA;VENTOLIN HFA) 108 (90 BASE) MCG/ACT inhaler  Inhale 2 puffs into the lungs every 6 (six) hours as needed. For asthma     .  FeFum-FePoly-FA-B Cmp-C-Biot (INTEGRA PLUS) CAPS  Take 1 capsule by mouth daily.  30 capsule  2   .  ibuprofen (ADVIL,MOTRIN) 200 MG tablet  Take 200 mg by mouth every 6 (six) hours as needed.     Marland Kitchen  oxyCODONE-acetaminophen (PERCOCET/ROXICET) 5-325 MG per tablet  Take 1-2 tablets by mouth every 4 (four) hours as needed (moderate - severe pain).  30 tablet  0   .  Prenatal Vit-Fe Fumarate-FA (PRENATAL MULTIVITAMIN) TABS  Take 1 tablet by  mouth daily.     .  hydrOXYzine (ATARAX/VISTARIL) 50 MG tablet  Take 25 mg by mouth every 4 (four) hours as needed. Pt says she takes for muscle spasms     .  norgestimate-ethinyl estradiol (ORTHO-CYCLEN, 28,) 0.25-35 MG-MCG tablet  Take 1 tablet by mouth daily.  1 Package  11    @ROS @  Physical Exam   Blood pressure 127/87, pulse 79, temperature 98.9 F (37.2 C), temperature source Oral, resp. rate 20, height 5\' 5"  (1.651 m), weight 225 lb (102.059 kg), last menstrual period 01/02/2012, SpO2 99.00%, currently breastfeeding.  @PHYSEXAMBYAGE2 @  Labs:  No results found for this or any previous visit  (from the past 24 hour(s)).  ASSESSMENT:  Patient Active Problem List   Diagnosis   .  Anemia in pregnancy   .  Pseudotumor cerebri   .  Asthma   .  NSVD (normal spontaneous vaginal delivery)    Physical Examination:  Physical exam: Calm, no distress, in semi fowlers position, HEENT grossly wnl lungs clear bilaterally, AP RRR, abd soft, nt, bowel sounds active, abdomen nontender fundus 13 week size  DTR +1 bilaterally lower legs  no clonus  No edema to lower extremities  No blood with fundal check with mini pad with scant serous to brown ED Course   Assessment/Plan  9 days PP Normal involution  Pseudotumor cerbri  Headache  PIH labs WNL Anesthesia r/o spinal headache Pain level from 5 to a 4 now, [facility] labs reviewed with Dr. Pennie Rushing, discussed with Dr. Rodman Pickle, gave option of home on meds with out pt neurology consult or stay. Opts to stay. Verbalized understanding of plan of care, MD will consult neurologist in am. Lavera Guise, CNM

## 2012-10-11 NOTE — Consult Note (Signed)
Initial  consult , this admission, for this  Mom who delivered a term baby a week ago, and was bottle/formula feeding. She was admitted with a headache, needing a nuero consult. She became engorged a few days after delivery, and then decided to provide breast milk for her baby. She ordered a DEP from her insurance company, but has never pumped. She tried latching the baby immediately after birth, but not since. i set up a DEP for mom, and had her pump in standard setting. She expressed about 30 mls of tranistional milk. I also showed mom how to hand express.  Her family was bringing in her baby to visit. I told her to call me if she wants to try and latch her baby.

## 2012-10-11 NOTE — Progress Notes (Signed)
Pt teaching  Complete   Ambulated out  

## 2012-10-12 ENCOUNTER — Telehealth: Payer: Self-pay | Admitting: Obstetrics and Gynecology

## 2012-10-12 LAB — URINE CULTURE

## 2012-10-12 NOTE — Telephone Encounter (Signed)
Tc to [facility] per msg below.  All pertinent information given regarding scheduling pt for an appt.  Per receptionist, I would need to speak with Diane.  Per Diane's voicemail, I would need to send a referral form, fax any office notes/records, she will call the pt with the appt and will fax back the referral form to our office to make [place] aware when appt is scheduled.  Referral form and info faxed, will await appt date and time from [facility] office.

## 2012-10-12 NOTE — Telephone Encounter (Signed)
Message copied by Delon Sacramento on Tue Oct 12, 2012 12:46 PM ------      Message from: Cornelius Moras      Created: Mon Oct 11, 2012  6:13 PM      Regarding: Appt with [facility]       Patient hospitalized 12/15--12/16 for pp HA--hx pseudotumor cerebri, with migraines in the past.       Had negative PIH w/u and negative head CT.      Consult from [facility] recommended Fioricet and restarting previous medication (Acetazolomide--a diuretic) at pre-pregnancy dose (250 mg po BID per patient).      Needs appt at [facility] in 1 week per their recommendation.      Please schedule and notify patient.      Thanks!      VL

## 2012-10-25 ENCOUNTER — Telehealth: Payer: Self-pay | Admitting: Obstetrics and Gynecology

## 2012-10-25 NOTE — Telephone Encounter (Signed)
Tc to pt regarding msg below.  Pt states she has spoken with them and will be settling her balance on Thursday and will be able to make an appt after the balance is paid.  Advised pt when she settles her balance to see if she can go ahead and make the appt and to call the office with the appointment date and time.  Informed she may ask for this RN with that information, pt voices agreement.

## 2012-10-25 NOTE — Telephone Encounter (Signed)
Message copied by Delon Sacramento on Mon Oct 25, 2012  9:59 AM ------      Message from: Cornelius Moras      Created: Tue Oct 19, 2012  8:33 PM      Regarding: RE: Appt       Need to let the patient know this, and reiterate the need for her to re-establish care with them.  Need to document the conversation.        Thanks!      VL      ----- Message -----         From: Delon Sacramento, RN         Sent: 10/19/2012  11:18 AM           To: Nigel Bridgeman, CNM      Subject: Appt                                                     Hey VL,            You had sent a staff msg on this pt that she needed to be referred to [facility] d/t her Pseudotumor...  This pt had just delivered and had a consult with a neuro MD.  I made the referral and had gotten a call back that the pt has a balance with them and she needs to pay the balance before an appt can be made for her.  Just an FYI, if you need me to do anything please let me know.            Thanks, Donnamae Jude

## 2012-10-28 NOTE — Discharge Summary (Signed)
Obstetric Discharge Summary Reason for Admission: onset of labor Prenatal Procedures: ultrasound Intrapartum Procedures: spontaneous vaginal delivery Postpartum Procedures: none Complications-Operative and Postpartum: 1st degree perineal laceration Hemoglobin  Date Value Range Status  10/02/2012 9.4* 12.0 - 15.0 g/dL Final     HCT  Date Value Range Status  10/02/2012 27.7* 36.0 - 46.0 % Final    Physical Exam:  Benign exam  Discharge Diagnoses: Term Pregnancy-delivered  Discharge Information: Date: 10/28/2012 Activity: unrestricted Diet: routine Medications: percocet, ortho cyclen and iron Condition: stable Instructions: refer to practice specific booklet Discharge to: home Follow-up Information    Follow up with [facility]. In 5 weeks.   Contact information:   [address] [place] [address] [phone]         Newborn Data: Live born female  Birth Weight: 6 lb 8 oz (2948 g) APGAR: 8, 9  Home with mother.  Daisy Werner,Daisy Werner Y 10/28/2012, 9:57 AM

## 2012-11-01 ENCOUNTER — Telehealth: Payer: Self-pay | Admitting: Obstetrics and Gynecology

## 2012-11-10 ENCOUNTER — Other Ambulatory Visit: Payer: Self-pay | Admitting: Neurology

## 2012-11-10 DIAGNOSIS — G932 Benign intracranial hypertension: Secondary | ICD-10-CM

## 2012-11-10 DIAGNOSIS — E669 Obesity, unspecified: Secondary | ICD-10-CM

## 2012-11-12 ENCOUNTER — Encounter: Payer: Self-pay | Admitting: Obstetrics and Gynecology

## 2012-11-12 ENCOUNTER — Ambulatory Visit: Payer: BC Managed Care – PPO | Admitting: Obstetrics and Gynecology

## 2012-11-12 DIAGNOSIS — Z331 Pregnant state, incidental: Secondary | ICD-10-CM

## 2012-11-12 NOTE — Progress Notes (Signed)
Date of delivery: 10/01/2012 Female Name: Everardo Beals  Vaginal delivery:yes Cesarean section:no Tubal ligation:no GDM:no Breast Feeding:no Bottle Feeding:yes Post-Partum Blues:no Abnormal pap:no Normal GU function: yes Normal GI function:yes Returning to work:yes EPDS: score 1

## 2012-11-14 NOTE — Progress Notes (Signed)
Subjective:     Daisy Werner is a 22 y.o. female who presents for a postpartum visit.  She has no c/o     General: doing well, bottle feeding  GI: normal bowel habits, no hemorrhoids GU: normal urinary function, vaginal bleeding has stopped, has had spotting, scant flow this week  Psychiatric: mood stable, no excessive stress at home, plans to return to work YES  I have fully reviewed the prenatal and intrapartum course.   Contraception: desires OCP   Review of Systems Pertinent items are noted in HPI.   Objective:    BP 110/72  Wt 213 lb (96.616 kg)  Breastfeeding? No  General:  alert, cooperative and no distress     Lungs: cear to auscultation bilaterally  Heart:  regular rate and rhythm, S1, S2 normal, no murmur  Abdomen: soft, non-tender; bowel sounds normal; no masses,  no organomegaly, diastasis <1FB    Vulva:  normal  Vagina: normal vagina, perineum healed   Cervix:  normal  Uterus : normal size, contour, position, consistency, mobility, non-tender  Adnexa:  normal adnexa             Assessment:     Normal postpartum exam.      Plan:  Pap smear not done at today's visit.  Desires OCP for contraception, Rx: ortho cyclen given  RTO PRN rv'd healthy eating habits and regular exercise  Sanda Klein, CNM  11/14/2012 4:10 PM

## 2012-11-15 ENCOUNTER — Ambulatory Visit
Admission: RE | Admit: 2012-11-15 | Discharge: 2012-11-15 | Disposition: A | Discharge status: Home / Self Care | Payer: BC Managed Care – PPO | Source: Ambulatory Visit | Attending: Neurology | Admitting: Neurology

## 2012-11-15 DIAGNOSIS — G932 Benign intracranial hypertension: Secondary | ICD-10-CM

## 2012-11-15 DIAGNOSIS — E669 Obesity, unspecified: Secondary | ICD-10-CM

## 2012-11-15 MED ORDER — GADOBENATE DIMEGLUMINE 529 MG/ML IV SOLN
20.0000 mL | Freq: Once | INTRAVENOUS | Status: AC | PRN
  Administered 2012-11-15: 20 mL via INTRAVENOUS

## 2012-11-24 ENCOUNTER — Other Ambulatory Visit: Payer: Self-pay | Admitting: Neurology

## 2012-11-25 ENCOUNTER — Other Ambulatory Visit: Payer: Self-pay | Admitting: Neurology

## 2012-11-25 DIAGNOSIS — G932 Benign intracranial hypertension: Secondary | ICD-10-CM

## 2012-11-29 ENCOUNTER — Ambulatory Visit
Admission: RE | Admit: 2012-11-29 | Discharge: 2012-11-29 | Disposition: A | Discharge status: Home / Self Care | Payer: BC Managed Care – PPO | Source: Ambulatory Visit | Attending: Neurology | Admitting: Neurology

## 2012-11-29 VITALS — BP 126/77 | HR 65

## 2012-11-29 DIAGNOSIS — O99019 Anemia complicating pregnancy, unspecified trimester: Secondary | ICD-10-CM

## 2012-11-29 DIAGNOSIS — J45909 Unspecified asthma, uncomplicated: Secondary | ICD-10-CM

## 2012-11-29 DIAGNOSIS — G43909 Migraine, unspecified, not intractable, without status migrainosus: Secondary | ICD-10-CM

## 2012-11-29 DIAGNOSIS — G932 Benign intracranial hypertension: Secondary | ICD-10-CM

## 2012-11-29 LAB — CSF CELL COUNT WITH DIFFERENTIAL
RBC Count, CSF: 0 cu mm
Tube #: 2
WBC, CSF: 1 cu mm (ref 0–5)

## 2012-11-29 NOTE — Progress Notes (Signed)
Discharge instructions explained to pt and her Mom.

## 2012-11-29 NOTE — Discharge Instructions (Signed)

## 2013-01-25 ENCOUNTER — Telehealth: Payer: Self-pay | Admitting: Neurology

## 2013-01-25 NOTE — Telephone Encounter (Signed)
Pt calling and is having headaches.   Is asking for an appt (light and sounds) bothering her.   LMVM for her to return call.

## 2013-01-26 ENCOUNTER — Other Ambulatory Visit: Payer: Self-pay | Admitting: Neurology

## 2013-01-26 MED ORDER — TOPIRAMATE 25 MG PO TABS: 25.0000 mg | ORAL_TABLET | Freq: Two times a day (BID) | ORAL | Status: DC

## 2013-01-26 NOTE — Telephone Encounter (Signed)
I called pt back this am and she is having daily headaches (for the last 2 wks).  Mainly L sided (forehead and temple) also with problems focusing and forgetting things.  Photosensitivity.    Has taken tylenol, advil migraine and this is not helping.   She has continued with taking diamox 500mg  po bid.  She had eye exam with Dr. Daphine Deutscher when 7 mo pregnant and will again in May at [facility].  Her baby now is 66 months old.  Last LP in 11/2012.  Has some visual blurriness, but now bad (wears her visual aides). [phone].  She does not have anymore fiorcet.  Restart topamax??

## 2013-01-26 NOTE — Telephone Encounter (Signed)
I will restart topiramate on top of diamox for headaches. She needs to see opthalmology for the vision changes if progressive.

## 2013-01-26 NOTE — Telephone Encounter (Signed)
Spoke to patient and told her Dr. Vickey Huger is going to restart Topiramate.  She said her vision changes were getting better.  She will call tomorrow after 2 to follow up.

## 2013-02-09 ENCOUNTER — Ambulatory Visit: Payer: Self-pay | Admitting: Nurse Practitioner

## 2013-04-06 ENCOUNTER — Ambulatory Visit: Payer: Self-pay | Admitting: Nurse Practitioner

## 2013-06-16 NOTE — Progress Notes (Signed)
..   Subjective:    Daisy Werner is a 22y.o. black female, being seen today for her first obstetrical visit.     She is [redacted]w[redacted]d determined by: Patient's last menstrual period was 01/02/2012.Marland Kitchen  Ultrasound: NO  Relationship w FOB: single Dx'd w/ pseudotumor cerebri summer/fall 2011.  Followed by [facility] Neuro, Dr. Vickey Huger. Also evaluated by Dr. Daphine Deutscher, ophthamologist at [facility].    She denies any vag bleeding, cramping, or discharge.  She denies nausea/vomiting.   Her obstetrical history is significant for: 1. Migraines 2. H/o pseudotumor cerebri 3. Asthma 4. Fm h/o cleft lip--pat 1st cousin 5. H/o chlamydia 6. obese  Review of Systems Pertinent ROS is described in HPI .Marland KitchenNo Known Allergies .Marland Kitchen Past Medical History  Diagnosis Date  . Asthma     INHALER PRN;WEATHER INDUCED  . Pseudotumor cerebri 07/2009    HAS HAD 2 SPINAL TAPS AND BLOOD PATCH IN 2010 &9/ 2011  . Headache(784.0)     MIGRAINES D/T PSEUDOTUMOR CEREBRI  . Weight gain     WT FLUCTUATES D/T PSEUDOTUMOR CEREBRI  . NSVD (normal spontaneous vaginal delivery) 10/01/2012  . Family history of cleft lip 03/26/2012    Paternal first cousin  .Marland Kitchen Past Surgical History  Procedure Laterality Date  . Wisdom tooth extraction  2007    ALL 4 EXTRACTED   Objective:   BP 130/54  Ht 5\' 5"  (1.651 m)  Wt 190 lb (86.183 kg)  BMI 31.62 kg/m2  LMP 01/02/2012 Wt Readings from Last 1 Encounters:  11/12/12 213 lb (96.616 kg)   BMI: Body mass index is 31.62 kg/(m^2).  General: alert, cooperative and no distress HEENT: grossly normal  Thyroid: normal  Respiratory: clear to auscultation bilaterally Cardiovascular: regular rate and rhythm  Breasts:  No dominant masses, nipples erect Gastrointestinal: soft, non-tender; no masses,  no organomegaly Extremities: extremities normal, no pain or edema  EXTERNAL GENITALIA: normal appearing vulva with no masses, tenderness or lesions VAGINA: no abnormal discharge, bleeding or  lesions CERVIX: no lesions or cervical motion tenderness; cervix closed, long, firm UTERUS: gravid and consistent with 11-12 weeks ADNEXA: no masses palpable and nontender OB EXAM PELVIMETRY: appears adequate  Assessment:    Primagravida at 12 weeks H/o migraines r/t pseudotumor cererbri H/o asthma obese  Plan:     Prenatal labs rv'd; Nml, including Hgb Elec on 03/17/12 Pap smear collected:  yes GC/Chlamydia collected:  yes Wet prep:  Not done Discussion of Genetic testing options: Desires 1st trimester screen Problem list reviewed and updated. rv'd how and when to call for emergencies rv'd practice routines Discussed nutrition and exercise and common pregnancy discomforts  Continue f/u w/ neurologist r/e headache treatment and to confirm no issues w/ mode of delivery.  F/u 1-2 weeks for 1st trimester screen, and 4 weeks for ROB  C. Denny Levy, CNM

## 2013-06-28 IMAGING — CT CT HEAD W/O CM
2 series · 16 of 30 positions shown, 20 images · non-contrast
Comparison: 06/27/2010.

CLINICAL DATA: 21-year-old female with headache. Status post C-
section delivery earlier this month.  History of pseudotumor
cerebri.

CT HEAD WITHOUT CONTRAST
TECHNIQUE: Contiguous axial images were obtained from the base of
the skull through the vertex without contrast.

[Series 2: routine head w/o · axial · non-contrast · 0.40mm/px · z∈[-167,-51]mm · 13 of 28 slices shown, 17 images]
[im 2/28  brain]
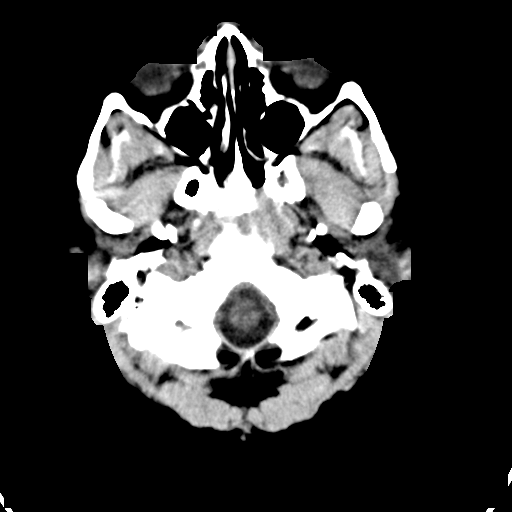
[im 2/28  bone]
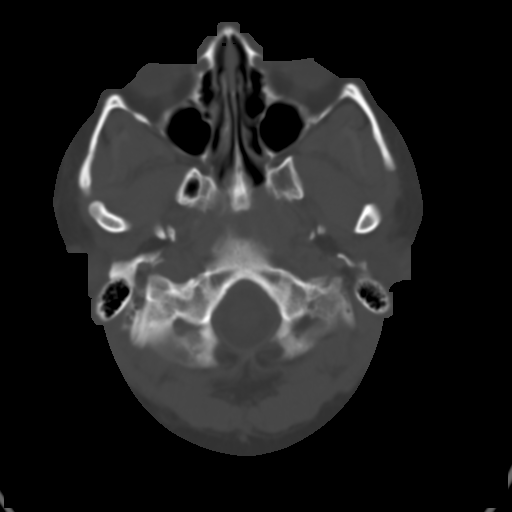
[im 4/28  brain]
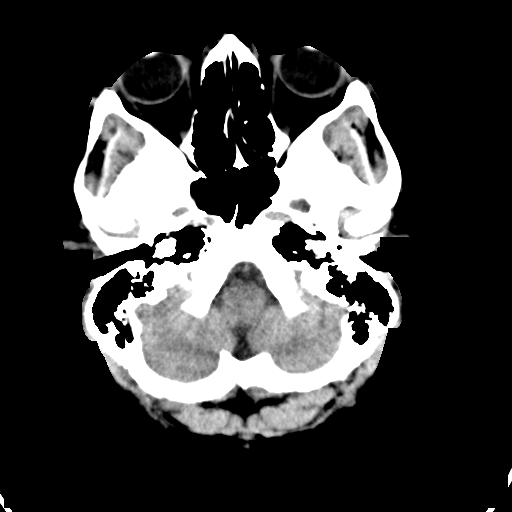
[im 6/28  brain]
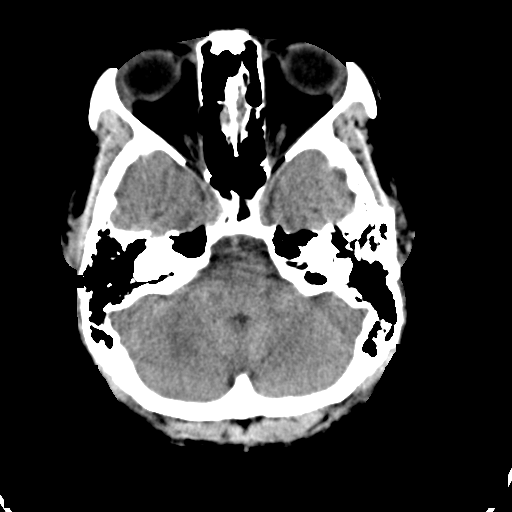
[im 8/28  brain]
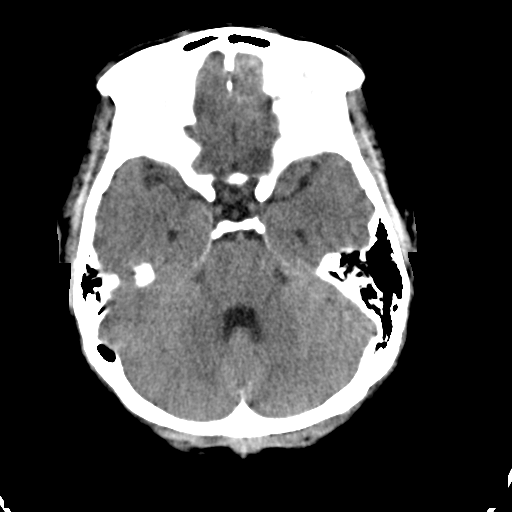
[im 10/28  brain]
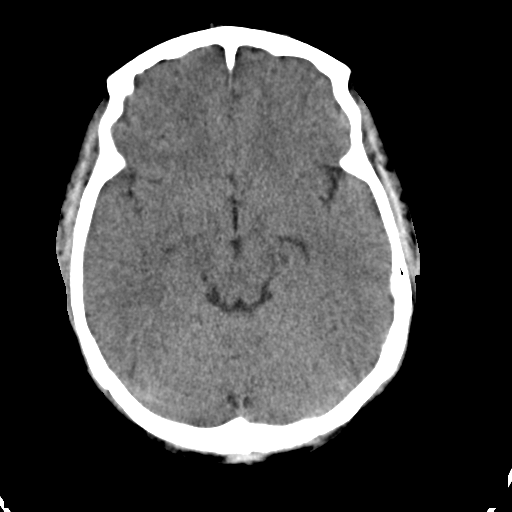
[im 10/28  bone]
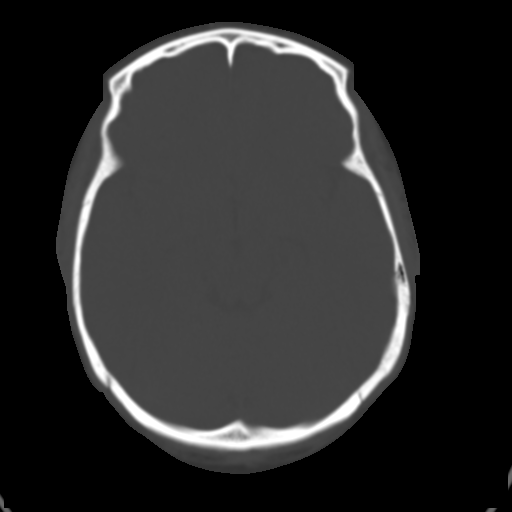
[im 12/28  brain]
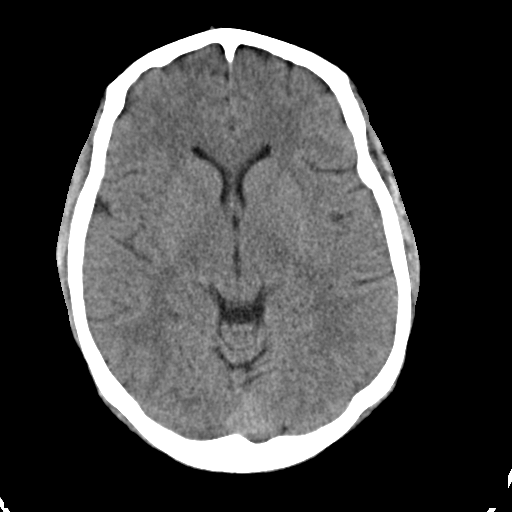
[im 14/28  brain]
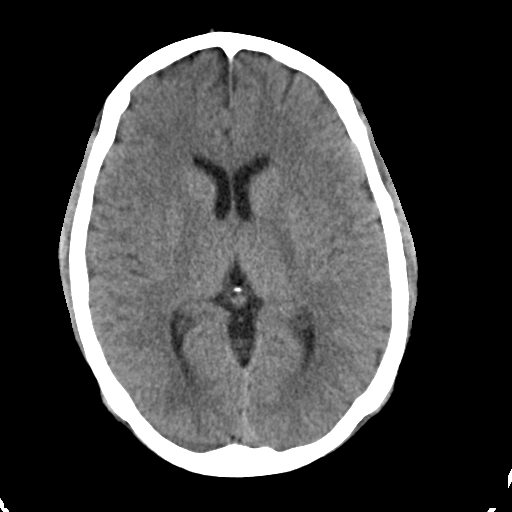
[im 16/28  brain]
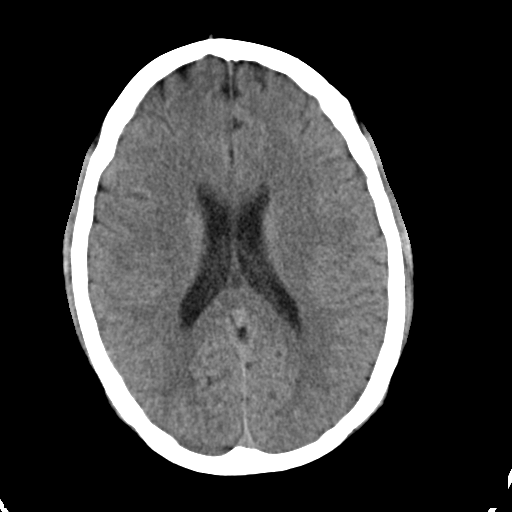
[im 18/28  brain]
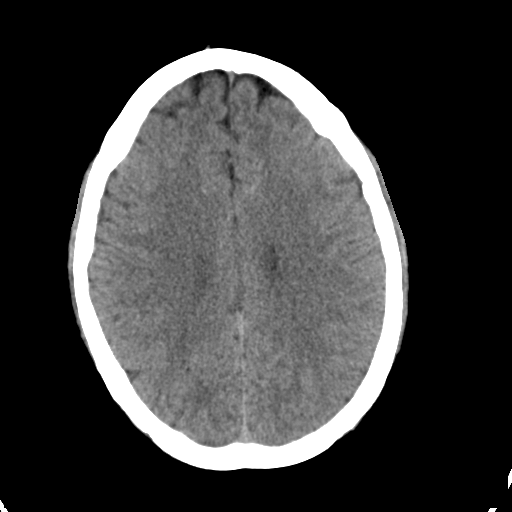
[im 18/28  bone]
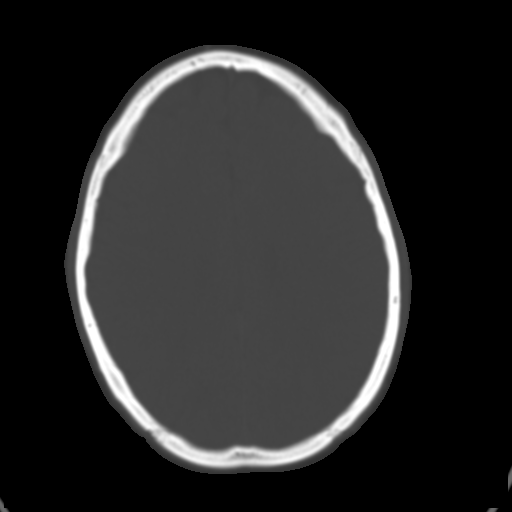
[im 20/28  brain]
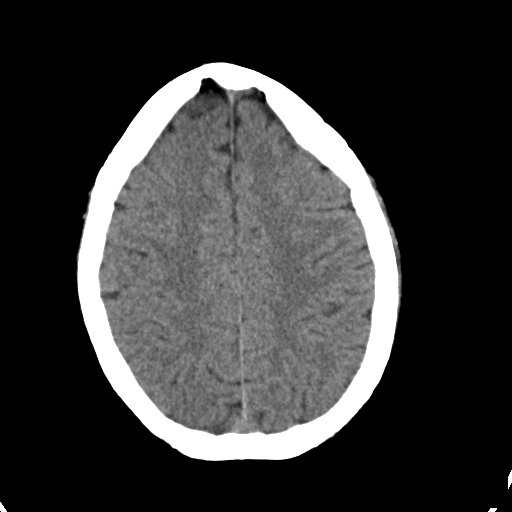
[im 22/28  brain]
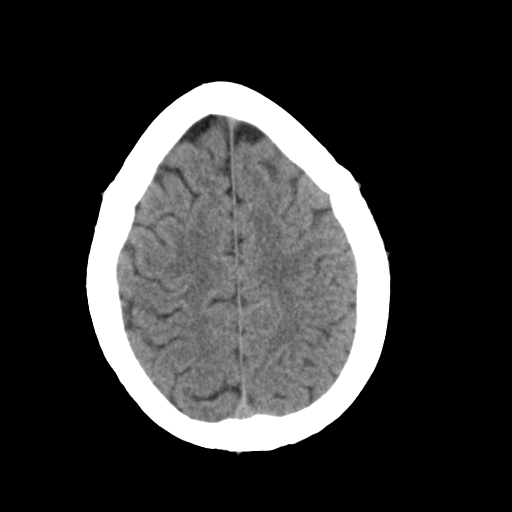
[im 24/28  brain]
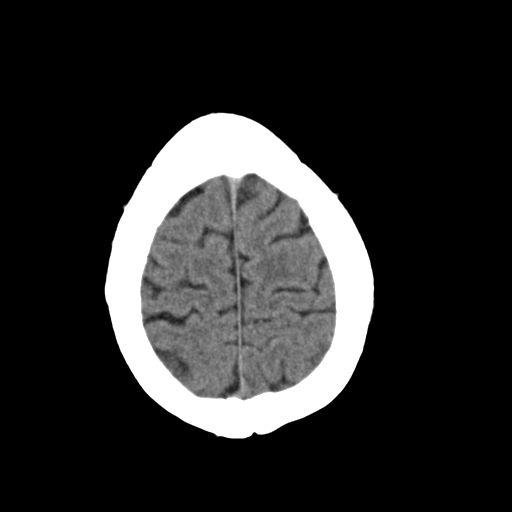
[im 26/28  brain]
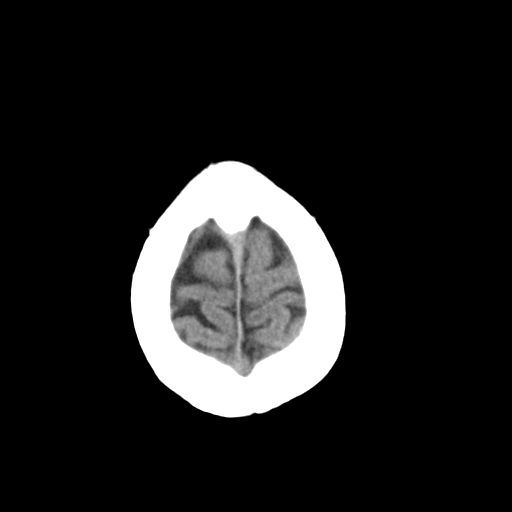
[im 26/28  bone]
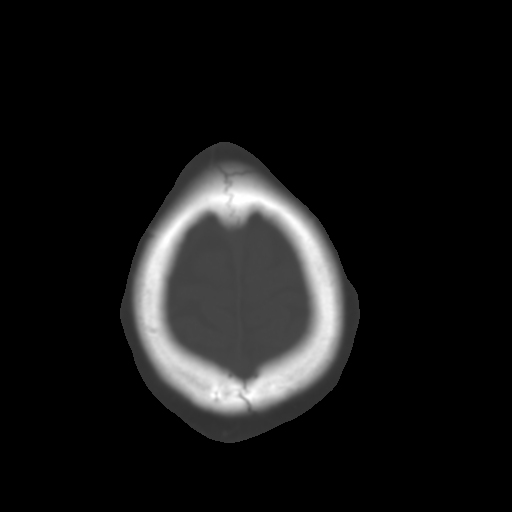

[Series 3: routine head bone · axial · 0.40mm/px · z∈[-167,-128]mm · 3 of 28 slices shown]
[im 2/28  bone]
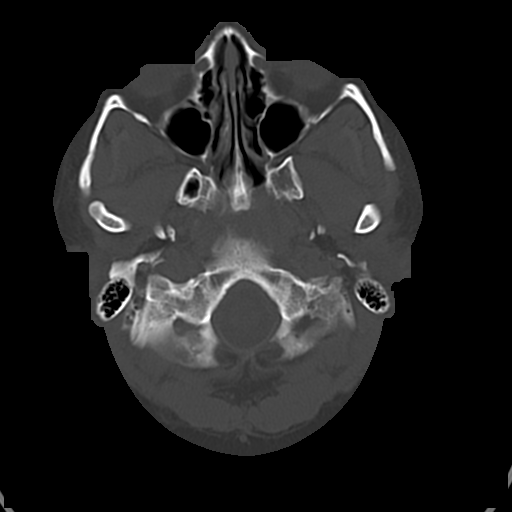
[im 6/28  bone]
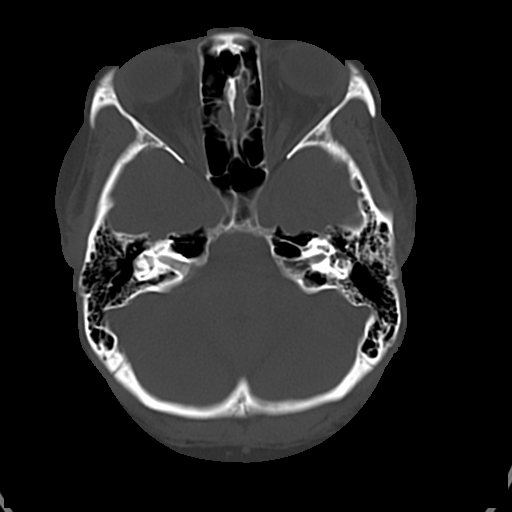
[im 10/28  bone]
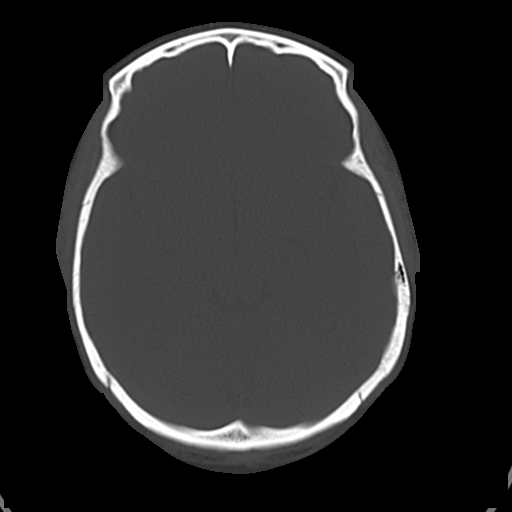

[16 of 30 positions shown; findings below may reference images not displayed]

FINDINGS: Visualized paranasal sinuses and mastoids are clear.  No
acute osseous abnormality identified.  Visualized orbits and scalp
soft tissues are within normal limits.

Stable and normal cerebral volume.  No ventriculomegaly. No midline
shift, mass effect, or evidence of mass lesion.  Gray-white matter
differentiation is within normal limits throughout the brain.  No
evidence of cortically based acute infarction identified.  No
suspicious intracranial vascular hyperdensity. No acute
intracranial hemorrhage identified.
IMPRESSION: Stable and normal noncontrast CT appearance of the brain.

## 2014-01-10 ENCOUNTER — Telehealth: Payer: Self-pay | Admitting: Diagnostic Neuroimaging

## 2014-01-10 ENCOUNTER — Telehealth: Payer: Self-pay

## 2014-01-10 ENCOUNTER — Ambulatory Visit (INDEPENDENT_AMBULATORY_CARE_PROVIDER_SITE_OTHER): Payer: BC Managed Care – PPO | Admitting: Diagnostic Neuroimaging

## 2014-01-10 ENCOUNTER — Encounter: Payer: Self-pay | Admitting: Diagnostic Neuroimaging

## 2014-01-10 VITALS — BP 142/96 | HR 78 | Ht 65.5 in | Wt 221.0 lb

## 2014-01-10 DIAGNOSIS — G932 Benign intracranial hypertension: Secondary | ICD-10-CM

## 2014-01-10 MED ORDER — TOPIRAMATE 50 MG PO TABS: 50.0000 mg | ORAL_TABLET | Freq: Two times a day (BID) | ORAL | Status: DC

## 2014-01-10 MED ORDER — BUTALBITAL-APAP-CAFFEINE 50-325-40 MG PO TABS: 1.0000 | ORAL_TABLET | Freq: Three times a day (TID) | ORAL | Status: DC | PRN

## 2014-01-10 MED ORDER — ACETAZOLAMIDE ER 500 MG PO CP12: 500.0000 mg | ORAL_CAPSULE | Freq: Two times a day (BID) | ORAL | Status: DC

## 2014-01-10 NOTE — Telephone Encounter (Signed)
Moreen from [facility] [place] called stating that the patient was prescribed 2 medications topiramate and Diamox,  that interacts with each other and may increase formation of kidney stones. I informed Dr. Marjory LiesPenumalli of this matter and the doctor stated that he was aware of this and that the patient had been on these 2 medications before and done well with them and the patient knew to drink plenty of water. Moreen verbalized understanding.

## 2014-01-10 NOTE — Telephone Encounter (Signed)
This has already been taken care of.  Please see previous note.   

## 2014-01-10 NOTE — Progress Notes (Signed)
[facility]  PATIENT: Daisy Werner DOB: 1991-06-25  REFERRING CLINICIAN:  HISTORY FROM: patient REASON FOR VISIT: urgent work in   HISTORICAL  CHIEF COMPLAINT:  Chief Complaint  Patient presents with  . Follow-up    headache    HISTORY OF PRESENT ILLNESS:   UPDATE 01/10/14: Since last visit patient ran out of medications in August 2014. Her headaches remain under control. However one to 2 weeks ago patient's headaches began to return. Now she is having "swishing" sound in her ears. 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 Patient called me last night complaining of severe increasing headaches. I arranged for her to come into the clinic today for urgent work in. Patient tells me that she was doing well on Diamox 500 mg twice a day and Topamax 50 mg twice a day. In terms of her weight, currently she is 221 pounds. She's gained 6 pounds in the last 2 months. Her maximum weight recently was 249 pounds when she was last pregnant. Her lowest weight was 175 pounds in 2011.   PRIOR HPI (11/10/12, Dr. Vickey Huger):  23 year old, right handed, African-American female with history of pseudotumor cerebri. She also has history of migraines about 3 years ago. She developed horizontal diplopia along with headaches in June, 2010.  She was seen by her PCP and an optometrist, who diagnosed papilledema, left > right. She had an MRI that was read as normal. She was seen by Dr Ninetta Lights at [facility] but did not feel that the visit was beneficial. She was seen in the ER at [facility] a few days later and had an LP in the ER at the recommendation of Dr Sharene Skeans, who was on call at the time. The opening pressure was 46 mm/H2O. CSF was drained to 15-59mm/H20. Laboratory studies on the CSF were normal. She reported some relief in her headache and diplopia improved. The patient was started on Diamox and Topiramate at the ER. She was referred to Dr  Vickey Huger for follow up.  When she was last seen by Dr Vickey Huger, her symptoms had improved and her fundoscopic exam showed residual pallor, left > right.  In August, 2011, she was experiencing nausea after taking her medication. We stopped Topamax, made changes to the timing of her medication and that nausea improved. She also complained of increase in blurred vision, papilledema and pulsatile tinnitus in her right >left ear and the decision was made to refer her for repeat lumbar puncture. That was performed on July 02, 2010. Although she unfortunately experienced post-LP headache that required blood patch for resolution, she said that the blurred vision and tinnitus improved for about 2 weeks. Then she gradually has had recurrence of both and is frustrated with her ongoing problems. She is fearful about the possibility of frequent lumbar punctures and wonders if there are other options for treatment.  January 15 th 2014 - patient re-referred  by her gynecologist  for migraine after recent pregancy , and she experienced worsening headaches. Headaches  start from the forehead towards the temples and retro-orbital .She has diplopia and blurring of vision. She will need a repeat  LP with OP. she gained weight with the pregancy.   REVIEW OF SYSTEMS: Full 14 system review of systems performed and notable only for dizziness headache vomiting double vision.  ALLERGIES: No Known Allergies  HOME MEDICATIONS: Outpatient Prescriptions Prior to Visit  Medication Sig Dispense Refill  . acetaminophen (TYLENOL)  500 MG tablet Take 500 mg by mouth every 6 (six) hours as needed. Back pain      . albuterol (PROVENTIL HFA;VENTOLIN HFA) 108 (90 BASE) MCG/ACT inhaler Inhale 2 puffs into the lungs every 6 (six) hours as needed. For asthma      . ibuprofen (ADVIL,MOTRIN) 200 MG tablet Take 200 mg by mouth every 6 (six) hours as needed.      . norgestimate-ethinyl estradiol (ORTHO-CYCLEN, 28,) 0.25-35 MG-MCG tablet Take 1  tablet by mouth daily.  1 Package  11  . acetaminophen-codeine (TYLENOL #3) 300-30 MG per tablet Take 1 tablet by mouth as needed.      Marland Kitchen. acetaZOLAMIDE (DIAMOX) 125 MG tablet Take 2 tablets (250 mg total) by mouth 2 (two) times daily.  60 tablet  1  . butalbital-acetaminophen-caffeine (FIORICET, ESGIC) 50-325-40 MG per tablet Take 2 tablets by mouth every 4 (four) hours as needed for headache.  36 tablet  2  . FeFum-FePoly-FA-B Cmp-C-Biot (INTEGRA PLUS) CAPS Take 1 capsule by mouth daily.  30 capsule  2  . hydrOXYzine (ATARAX/VISTARIL) 50 MG tablet Take 25 mg by mouth every 4 (four) hours as needed. Pt says she takes for muscle spasms      . oxyCODONE-acetaminophen (PERCOCET/ROXICET) 5-325 MG per tablet Take 1-2 tablets by mouth every 4 (four) hours as needed (moderate - severe pain).  30 tablet  0  . Prenatal Vit-Fe Fumarate-FA (PRENATAL MULTIVITAMIN) TABS Take 1 tablet by mouth daily.      Marland Kitchen. topiramate (TOPAMAX) 25 MG tablet Take 1 tablet (25 mg total) by mouth 2 (two) times daily.  60 tablet  5   No facility-administered medications prior to visit.    PAST MEDICAL HISTORY: Past Medical History  Diagnosis Date  . Asthma     INHALER PRN;WEATHER INDUCED  . Pseudotumor cerebri 07/2009    HAS HAD 2 SPINAL TAPS AND BLOOD PATCH IN 2010 &9/ 2011  . Headache(784.0)     MIGRAINES D/T PSEUDOTUMOR CEREBRI  . Weight gain     WT FLUCTUATES D/T PSEUDOTUMOR CEREBRI  . NSVD (normal spontaneous vaginal delivery) 10/01/2012  . Family history of cleft lip 03/26/2012    Paternal first cousin    PAST SURGICAL HISTORY: Past Surgical History  Procedure Laterality Date  . Wisdom tooth extraction  2007    ALL 4 EXTRACTED    FAMILY HISTORY: Family History  Problem Relation Age of Onset  . Heart disease Paternal Grandmother     CHF  . Other Maternal Aunt     VARICOSE VEINS  . Other Maternal Grandmother     VARICOSE VEINS  . Seizures Cousin     MATERANL;EPILEPSY  . Seizures Maternal Aunt      MATERNAL  . Migraines Maternal Aunt   . Cancer Maternal Grandmother     BREAST TO BONES  . Hypertension Mother   . Hypertension Father   . Hypertension Maternal Aunt     2 AUNTS  . Hypertension Maternal Uncle     1 UNCLE  . Hypertension Paternal Aunt     4 AUNTS  . Hypertension Paternal Uncle     1 UNCLE  . Hypertension Brother   . Diabetes Father     ORAL MEDS  . Diabetes Brother     IDDM  . Stroke Paternal Grandmother   . Drug abuse Maternal Uncle   . Other Paternal Uncle     NICOTINE;MJ USE    SOCIAL HISTORY:  History   Social History  .  Marital Status: Single    Spouse Name: N/A    Number of Children: 1  . 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 Smoking status: Former Smoker    Types: Cigars  . Smokeless tobacco: Never Used     Comment: PT DID NOT SMOKE EVERY DAY  . Alcohol Use: No     Comment: SOCIAL DRINKER IN PAST  . Drug Use: No  . Sexual Activity: Yes    Partners: Male    Birth Control/ Protection: Pill     Comment: spintec    Other Topics Concern  . Not on file   Social History Narrative   Patient lives at home with family.   Caffeine Use: 1 soda daily     PHYSICAL EXAM  Filed Vitals:   01/10/14 1331  BP: 142/96  Pulse: 78  Height: 5' 5.5" (1.664 m)  Weight: 221 lb (100.245 kg)    Not recorded    Body mass index is 36.2 kg/(m^2).  GENERAL EXAM: Patient is in no distress; well developed, nourished and groomed; neck is supple  CARDIOVASCULAR: Regular rate and rhythm, no murmurs, no carotid bruits  NEUROLOGIC: MENTAL STATUS: awake, alert, oriented to person, place and time, recent and remote memory intact, normal attention and concentration, language fluent, comprehension intact, naming intact, fund of knowledge appropriate CRANIAL NERVE: BLURRED DISC MARGINS IN OS > OD. PHOTOSENSITIVE. Pupils equal and reactive to light, visual fields full to confrontation,  extraocular muscles intact, no nystagmus, facial sensation and strength symmetric, hearing intact, palate elevates symmetrically, uvula midline, shoulder shrug symmetric, tongue midline. MOTOR: normal bulk and tone, full strength in the BUE, BLE SENSORY: normal and symmetric to light touch COORDINATION: finger-nose-finger, fine finger movements normal REFLEXES: deep tendon reflexes present and symmetric GAIT/STATION: narrow based gait.    DIAGNOSTIC DATA (LABS, IMAGING, TESTING) - I reviewed patient records, labs, notes, testing and imaging myself where available.  Lab Results  Component Value Date   WBC 12.8* 10/02/2012   HGB 9.4* 10/02/2012   HCT 27.7* 10/02/2012   MCV 86.8 10/02/2012   PLT 243 10/02/2012      Component Value Date/Time   NA 135 10/10/2012 2317   K 3.7 10/10/2012 2317   CL 101 10/10/2012 2317   CO2 24 10/10/2012 2317   GLUCOSE 86 10/10/2012 2317   BUN 12 10/10/2012 2317   CREATININE 0.78 10/10/2012 2317   CALCIUM 8.9 10/10/2012 2317   PROT 6.5 10/10/2012 2317   ALBUMIN 3.1* 10/10/2012 2317   AST 21 10/10/2012 2317   ALT 19 10/10/2012 2317   ALKPHOS 46 10/10/2012 2317   BILITOT 0.2* 10/10/2012 2317   GFRNONAA >90 10/10/2012 2317   GFRAA >90 10/10/2012 2317   No results found for this basename: CHOL, HDL, LDLCALC, LDLDIRECT, TRIG, CHOLHDL   No results found for this basename: HGBA1C   No results found for this basename: VITAMINB12   No results found for this basename: TSH    11/15/12 MRI brain (with and without contrast) - slightly enlarged optic nerve sheaths. This is non-specific but can be seen in association with pseudotumor cerebri.   ASSESSMENT AND PLAN  23 y.o. year old female here with IIH (pseudotumor cerebri). Here with worsening headaches and tinnitus.  Dx: pseudotumor cerebri exacerbation (in setting of stopping her medication)  PLAN: - LP this week - restart acetazolamide + topiramate - fioricet prn HA, #30, no refills - wrote  patient  out of work until Monday - follow up with Dr. Criselda Peaches   Orders Placed This Encounter  Procedures  . DG FLUORO GUIDE LUMBAR PUNCTURE    Meds ordered this encounter  Medications  . acetaZOLAMIDE (DIAMOX) 500 MG capsule    Sig: Take 1 capsule (500 mg total) by mouth 2 (two) times daily.    Dispense:  60 capsule    Refill:  12  . topiramate (TOPAMAX) 50 MG tablet    Sig: Take 1 tablet (50 mg total) by mouth 2 (two) times daily.    Dispense:  60 tablet    Refill:  12  . butalbital-acetaminophen-caffeine (FIORICET, ESGIC) 50-325-40 MG per tablet    Sig: Take 1 tablet by mouth every 8 (eight) hours as needed for headache.    Dispense:  30 tablet    Refill:  0    Return in about 3 months (around 04/12/2014) for with Dr. Vickey Huger.    Suanne Marker, MD 01/10/2014, 2:18 PM Certified in Neurology, Neurophysiology and Neuroimaging  [facility] [address] [place], [name] [address] [phone]

## 2014-01-10 NOTE — Telephone Encounter (Signed)
Called to confirm patient will be in for work-in visit today for 1pm appt. Arrival @ 12:45. No answer. Left message on pt vmail.

## 2014-01-10 NOTE — Telephone Encounter (Signed)
Patient was told by the pharmacist that there is an interaction between Diamox and Topamax and to have the doctor call the pharmacy to verify that it is okay for patient to use this or to prescribe something different. [facility] uses [facility] [place] [phone]

## 2014-01-13 ENCOUNTER — Ambulatory Visit
Admission: RE | Admit: 2014-01-13 | Discharge: 2014-01-13 | Disposition: A | Discharge status: Home / Self Care | Payer: BC Managed Care – PPO | Source: Ambulatory Visit | Attending: Diagnostic Neuroimaging | Admitting: Diagnostic Neuroimaging

## 2014-01-13 VITALS — BP 141/83 | HR 72

## 2014-01-13 DIAGNOSIS — G932 Benign intracranial hypertension: Secondary | ICD-10-CM

## 2014-01-13 LAB — CSF CELL COUNT WITH DIFFERENTIAL
RBC Count, CSF: 0 cu mm
Tube #: 2
WBC, CSF: 1 cu mm (ref 0–5)

## 2014-01-13 LAB — PROTEIN, CSF: Total Protein, CSF: 16 mg/dL (ref 15–45)

## 2014-01-13 LAB — GLUCOSE, CSF: GLUCOSE CSF: 54 mg/dL (ref 43–76)

## 2014-01-13 NOTE — Discharge Instructions (Signed)
Lumbar Puncture Discharge Instructions ° °1. Go home and rest quietly for the next 24 hours.  It is important to lie flat for the next 24 hours.  Get up only to go to the restroom.  You may lie in the bed or on a couch on your back, your stomach, your left side or your right side.  You may have one pillow under your head.  You may have pillows between your knees while you are on your side or under your knees while you are on your back. ° °2. DO NOT drive today.  Recline the seat as far back as it will go, while still wearing your seat belt, on the way home. ° °3. You may get up to go to the bathroom as needed.  You may sit up for 10 minutes to eat.  You may resume your normal diet and medications unless otherwise indicated.  Drink plenty of extra fluids today and tomorrow. ° °4. The incidence of a spinal headache with nausea and/or vomiting is about 5% (one in 20 patients).  If you develop a headache, lie flat and drink plenty of fluids until the headache goes away.  Caffeinated beverages may be helpful.  If you develop severe nausea and vomiting or a headache that does not go away with flat bed rest, call [phone]. ° °5. You may resume normal activities after your 24 hours of bed rest is over; however, do not exert yourself strongly or do any heavy lifting tomorrow. ° °6. Call your physician for a follow-up appointment.  °

## 2014-01-14 LAB — GRAM STAIN
GRAM STAIN: NONE SEEN
Gram Stain: NONE SEEN
Gram Stain: NONE SEEN

## 2014-01-16 LAB — CSF CULTURE W GRAM STAIN
Gram Stain: NONE SEEN
Organism ID, Bacteria: NO GROWTH

## 2014-01-16 LAB — CSF CULTURE: Gram Stain: NONE SEEN

## 2014-01-19 ENCOUNTER — Telehealth: Payer: Self-pay | Admitting: Diagnostic Neuroimaging

## 2014-01-19 NOTE — Telephone Encounter (Signed)
Pt called states she had the Lumbar test done last week and since then she has been having back pain, no headaches just hurting in her back. Pt wants to know is there something that she can take. Please call pt back concerning this matter.

## 2014-01-20 NOTE — Telephone Encounter (Signed)
Pt called back.  She is c/o of back pain where had the LP.  Level 7.5 out of 10.  Has tried otc, aspirin, tylenol, aleve, excedrin.  No headaches, just back pain.  She had not tried heat, since sleeps with daughter.  She is back at work, [employer], Therapist, [employer].  Since multiple LP sticks, issue with spasms before.  C/o neck hurting to.  No allergies.  [phone]

## 2014-01-20 NOTE — Telephone Encounter (Signed)
[facility] and [facility] on pts VM of message below.  If gets so severe, may need to seek care at urgent care.

## 2014-01-20 NOTE — Telephone Encounter (Signed)
Recommend OTC ibuprofen, aleve or tylenol only. Symptoms should gradually improve over time. -VRP

## 2014-04-12 ENCOUNTER — Ambulatory Visit: Payer: BC Managed Care – PPO | Admitting: Neurology

## 2014-08-28 ENCOUNTER — Encounter: Payer: Self-pay | Admitting: Diagnostic Neuroimaging

## 2015-02-20 ENCOUNTER — Telehealth: Payer: Self-pay | Admitting: Diagnostic Neuroimaging

## 2015-02-20 NOTE — Telephone Encounter (Signed)
Patient called and stated she needs a refill on Rx. acetaZOLAMIDE (DIAMOX) 500 MG capsule and Rx. topiramate (TOPAMAX) 50 MG tablet. Please call and advise.

## 2015-02-20 NOTE — Telephone Encounter (Signed)
Patient was last seen one year ago.  Last appt scheduled was cancelled.  I called back.  Patient will check her calendar and call back to schedule appt.

## 2015-02-21 ENCOUNTER — Other Ambulatory Visit: Payer: Self-pay | Admitting: Diagnostic Neuroimaging

## 2015-02-21 NOTE — Telephone Encounter (Signed)
Appt has been scheduled.  Rx's sent.

## 2015-02-21 NOTE — Telephone Encounter (Signed)
Patient has appt scheduled

## 2015-04-03 ENCOUNTER — Encounter: Payer: Self-pay | Admitting: Neurology

## 2015-04-03 ENCOUNTER — Ambulatory Visit (INDEPENDENT_AMBULATORY_CARE_PROVIDER_SITE_OTHER): Payer: BLUE CROSS/BLUE SHIELD | Admitting: Neurology

## 2015-04-03 VITALS — BP 122/84 | HR 80 | Resp 20 | Ht 65.35 in | Wt 209.0 lb

## 2015-04-03 DIAGNOSIS — E669 Obesity, unspecified: Secondary | ICD-10-CM | POA: Diagnosis not present

## 2015-04-03 DIAGNOSIS — G932 Benign intracranial hypertension: Secondary | ICD-10-CM | POA: Insufficient documentation

## 2015-04-03 MED ORDER — TOPIRAMATE 50 MG PO TABS: 50.0000 mg | ORAL_TABLET | Freq: Two times a day (BID) | ORAL | Status: DC

## 2015-04-03 MED ORDER — ACETAZOLAMIDE ER 500 MG PO CP12: 500.0000 mg | ORAL_CAPSULE | Freq: Two times a day (BID) | ORAL | Status: DC

## 2015-04-03 NOTE — Progress Notes (Signed)
[facility]  PATIENT: Daisy Werner DOB: Nov 03, 1990  REFERRING CLINICIAN:  HISTORY FROM: patient REASON FOR VISIT: urgent work in   HISTORICAL  CHIEF COMPLAINT:  Chief Complaint  Patient presents with  . Follow-up    pseudotumor cerebri, rm 10, with daughter    HISTORY OF PRESENT ILLNESS:  Update 04-03-15, Mrs. Nicklaus is here for her yearly revisit. She carries the diagnosis of benign intracranial hypertension also known as pseudotumor cerebri. She has been followed by gynecologist Dr. Stefano Gaul who initially made the diagnosis. She underwent after last visit with Dr. panel mildly in March 2015 a spinal tap which showed entirely normal fluids and cell population. There were no oligoclonal proteins glucose or Gram stain findings. Her opening pressure however confirmed that she had hypertension. It was about 37 cm water pressure and after removal of 30 mL's the closing pressure was 12 cm water and her headaches subsided. The patient reports that she has noticed a correlation between symptoms of benign intracranial hypertension and her body weight. She tries to keep her body weight at 200 pounds if her body weight exceeds this limited she finds herself with new symptoms. She also has maintained on medication. Continues to take Diamox and Topamax.   UPDATE 01/10/14: VP  Since last visit patient ran out of medications in August 2014. Her headaches remain under control. However one to 2 weeks ago patient's headaches began to return. Now she is having "swishing" sound in her ears. 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 Patient called me last night complaining of severe increasing headaches. I arranged for her to come into the clinic today for urgent work in. Patient tells me that she was doing well on Diamox 500 mg twice a day and Topamax 50 mg twice a day. In terms of her weight, currently she is 221 pounds. She's gained 6  pounds in the last 2 months. Her maximum weight recently was 249 pounds when she was last pregnant. Her lowest weight was 175 pounds in 2011.   PRIOR HPI (11/11/11, Dr. Vickey Huger):  24 year old, right handed, African-American female with history of pseudotumor cerebri. She also has history of migraines about 3 years ago. She developed horizontal diplopia along with headaches in June, 2010.  She was seen by her PCP and an optometrist, who diagnosed papilledema, left > right. She had an MRI that was read as normal. She was seen by Dr Ninetta Lights at [facility] but did not feel that the visit was beneficial. She was seen in the ER at [facility] a few days later and had an LP in the ER at the recommendation of Dr Sharene Skeans, who was on call at the time. The opening pressure was 46 mm/H2O. CSF was drained to 15-41mm/H20. Laboratory studies on the CSF were normal. She reported some relief in her headache and diplopia improved. The patient was started on Diamox and Topiramate at the ER. She was referred to Dr Vickey Huger for follow up.  When she was last seen by Dr Vickey Huger, her symptoms had improved and her fundoscopic exam showed residual pallor, left > right.  In August, 2011, she was experiencing nausea after taking her medication. We stopped Topamax, made changes to the timing of her medication and that nausea improved. She also complained of increase in blurred vision, papilledema and pulsatile tinnitus in her right >left ear and the decision was made to refer her for repeat lumbar puncture. That was  performed on July 02, 2010. Although she unfortunately experienced post-LP headache that required blood patch for resolution, she said that the blurred vision and tinnitus improved for about 2 weeks. Then she gradually has had recurrence of both and is frustrated with her ongoing problems. She is fearful about the possibility of frequent lumbar punctures and wonders if there are other options for treatment.  January  15 th 2014 - patient re-referred  by her gynecologist  for migraine after recent pregancy , and she experienced worsening headaches. Headaches  start from the forehead towards the temples and retro-orbital .She has diplopia and blurring of vision. She will need a repeat  LP with OP. she gained weight with the pregancy.   REVIEW OF SYSTEMS: Full 14 system review of systems performed and notable only for dizziness headache vomiting double vision.  ALLERGIES: No Known Allergies  HOME MEDICATIONS: Outpatient Prescriptions Prior to Visit  Medication Sig Dispense Refill  . acetaminophen (TYLENOL) 500 MG tablet Take 500 mg by mouth every 6 (six) hours as needed. Back pain    . acetaZOLAMIDE (DIAMOX) 500 MG capsule TAKE ONE CAPSULE BY MOUTH TWICE DAILY 60 capsule 1  . albuterol (PROVENTIL HFA;VENTOLIN HFA) 108 (90 BASE) MCG/ACT inhaler Inhale 2 puffs into the lungs every 6 (six) hours as needed. For asthma    . butalbital-acetaminophen-caffeine (FIORICET, ESGIC) 50-325-40 MG per tablet Take 1 tablet by mouth every 8 (eight) hours as needed for headache. 30 tablet 0  . ibuprofen (ADVIL,MOTRIN) 200 MG tablet Take 200 mg by mouth every 6 (six) hours as needed.    . topiramate (TOPAMAX) 50 MG tablet TAKE ONE TABLET BY MOUTH TWICE DAILY 60 tablet 1  . norgestimate-ethinyl estradiol (ORTHO-CYCLEN, 28,) 0.25-35 MG-MCG tablet Take 1 tablet by mouth daily. 1 Package 11   No facility-administered medications prior to visit.    PAST MEDICAL HISTORY: Past Medical History  Diagnosis Date  . Asthma     INHALER PRN;WEATHER INDUCED  . Pseudotumor cerebri 07/2009    HAS HAD 2 SPINAL TAPS AND BLOOD PATCH IN 2010 &9/ 2011  . Headache(784.0)     MIGRAINES D/T PSEUDOTUMOR CEREBRI  . Weight gain     WT FLUCTUATES D/T PSEUDOTUMOR CEREBRI  . NSVD (normal spontaneous vaginal delivery) 10/01/2012  . Family history of cleft lip 03/26/2012    Paternal first cousin    PAST SURGICAL HISTORY: Past Surgical History    Procedure Laterality Date  . Wisdom tooth extraction  2007    ALL 4 EXTRACTED    FAMILY HISTORY: Family History  Problem Relation Age of Onset  . Heart disease Paternal Grandmother     CHF  . Other Maternal Aunt     VARICOSE VEINS  . Other Maternal Grandmother     VARICOSE VEINS  . Seizures Cousin     MATERANL;EPILEPSY  . Seizures Maternal Aunt     MATERNAL  . Migraines Maternal Aunt   . Cancer Maternal Grandmother     BREAST TO BONES  . Hypertension Mother   . Hypertension Father   . Hypertension Maternal Aunt     2 AUNTS  . Hypertension Maternal Uncle     1 UNCLE  . Hypertension Paternal Aunt     4 AUNTS  . Hypertension Paternal Uncle     1 UNCLE  . Hypertension Brother   . Diabetes Father     ORAL MEDS  . Diabetes Brother     IDDM  . Stroke Paternal Grandmother   .  Drug abuse Maternal Uncle   . Other Paternal Uncle     NICOTINE;MJ USE    SOCIAL HISTORY:  History   Social History  . Marital Status: Single    Spouse Name: N/A  . Number of Children: 1  . 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 Smoking status: Former Smoker    Types: Cigars  . Smokeless tobacco: Never Used     Comment: PT DID NOT SMOKE EVERY DAY  . Alcohol Use: No     Comment: SOCIAL DRINKER IN PAST  . Drug Use: No  . Sexual Activity:    Partners: Male    Birth Control/ Protection: Pill     Comment: spintec    Other Topics Concern  . Not on file   Social History Narrative   Patient lives at home with family.   Caffeine Use: 1 soda daily     PHYSICAL EXAM  Filed Vitals:   04/03/15 1107  BP: 122/84  Pulse: 80  Resp: 20  Height: 5' 5.35" (1.66 m)  Weight: 209 lb (94.802 kg)    Not recorded      Body mass index is 34.4 kg/(m^2).  GENERAL EXAM: Patient is in no distress; well developed, nourished and groomed; neck is supple  CARDIOVASCULAR: Regular rate and rhythm, no murmurs, no carotid  bruits  NEUROLOGIC: MENTAL STATUS: awake, alert, oriented to person, place and time, recent and remote memory intact, normal attention and concentration, language fluent, comprehension intact, naming intact, fund of knowledge appropriate CRANIAL NERVE: BLURRED DISC MARGINS IN OS > OD. PHOTOSENSITIVE. Pupils equal and reactive to light, visual fields full to confrontation, extraocular muscles intact, no nystagmus, facial sensation and strength symmetric, hearing intact, palate elevates symmetrically, uvula midline, shoulder shrug symmetric, tongue midline. MOTOR: normal bulk and tone, full strength in the BUE, BLE SENSORY: normal and symmetric to light touch COORDINATION: finger-nose-finger, fine finger movements normal REFLEXES: deep tendon reflexes present and symmetric GAIT/STATION: narrow based gait.    DIAGNOSTIC DATA (LABS, IMAGING, TESTING) - I reviewed patient records, labs, notes, testing and imaging myself where available.  Lab Results  Component Value Date   WBC 12.8* 10/02/2012   HGB 9.4* 10/02/2012   HCT 27.7* 10/02/2012   MCV 86.8 10/02/2012   PLT 243 10/02/2012      Component Value Date/Time   NA 135 10/10/2012 2317   K 3.7 10/10/2012 2317   CL 101 10/10/2012 2317   CO2 24 10/10/2012 2317   GLUCOSE 86 10/10/2012 2317   BUN 12 10/10/2012 2317   CREATININE 0.78 10/10/2012 2317   CALCIUM 8.9 10/10/2012 2317   PROT 6.5 10/10/2012 2317   ALBUMIN 3.1* 10/10/2012 2317   AST 21 10/10/2012 2317   ALT 19 10/10/2012 2317   ALKPHOS 46 10/10/2012 2317   BILITOT 0.2* 10/10/2012 2317   GFRNONAA >90 10/10/2012 2317   GFRAA >90 10/10/2012 2317   No results found for: CHOL No results found for: HGBA1C No results found for: VITAMINB12 No results found for: TSH  11/15/12 MRI brain (with and without contrast) - slightly enlarged optic nerve sheaths. This is non-specific but can be seen in association with pseudotumor cerebri.   ASSESSMENT AND PLAN  24 y.o. year old female  here with IIH (pseudotumor cerebri). headaches and tinnitus resolved after LP in Match 2015 ,  with opening pressure of 37 cm water, closing at 12 cm water. The last Headache was  3 weeks ago.  She can take OTC medication to treat this.  [id number]  pseudotumor cerebri after  stopping her medication in 2015, now compliant .  PLAN:  - continue acetazolamide + topiramate - fioricet was prescribed last year by dr. Marjory Lies, no refills - follow up with NP yearly .  Maybell Misenheimer, MD   04/03/2015, 11:28 AM Certified in Neurology, Neurophysiology and Sleep medicine  [facility] [address], [address] [place], [name] [address] [phone]

## 2016-04-02 ENCOUNTER — Ambulatory Visit: Payer: BLUE CROSS/BLUE SHIELD | Admitting: Nurse Practitioner

## 2016-07-29 ENCOUNTER — Encounter: Payer: Self-pay | Admitting: Nurse Practitioner

## 2016-07-29 ENCOUNTER — Ambulatory Visit (INDEPENDENT_AMBULATORY_CARE_PROVIDER_SITE_OTHER): Payer: BLUE CROSS/BLUE SHIELD | Admitting: Nurse Practitioner

## 2016-07-29 VITALS — BP 136/68 | HR 104 | Ht 65.35 in | Wt 226.4 lb

## 2016-07-29 DIAGNOSIS — G932 Benign intracranial hypertension: Secondary | ICD-10-CM | POA: Diagnosis not present

## 2016-07-29 DIAGNOSIS — G43909 Migraine, unspecified, not intractable, without status migrainosus: Secondary | ICD-10-CM

## 2016-07-29 DIAGNOSIS — E669 Obesity, unspecified: Secondary | ICD-10-CM | POA: Diagnosis not present

## 2016-07-29 MED ORDER — TOPIRAMATE 50 MG PO TABS: 50.0000 mg | ORAL_TABLET | Freq: Two times a day (BID) | ORAL | 3 refills | Status: DC

## 2016-07-29 MED ORDER — ACETAZOLAMIDE ER 500 MG PO CP12: 500.0000 mg | ORAL_CAPSULE | Freq: Two times a day (BID) | ORAL | 3 refills | Status: DC

## 2016-07-29 NOTE — Patient Instructions (Signed)
Continue Diamox at current dose will refill Continue Topamax 50 mg twice daily Follow-up yearly and when necessary

## 2016-07-29 NOTE — Progress Notes (Signed)
s he was last timethey also use   [facility]  PATIENT: Daisy Werner DOB: 1991-07-03   REASON FOR VISIT: Follow-up for intracranial hypertension, headaches HISTORY FROM: Patient    HISTORY OF PRESENT ILLNESS:UPDATE 10/03/2017CM Ms. Su HiltRoberts, 25 year old female returns for follow-up. She has a history of pseudotumor cerebri. She has been off of her Diamox and her Topamax for over a month and her headaches have returned. In addition she is gained about15 pounds since last seen 04/03/2015. She says she likes to keep her weight around 200 pounds and when she exceeds that she has more headaches. She returns for reevaluation.  Update 04-03-15,CD Mrs. Su HiltRoberts is here for her yearly revisit. She carries the diagnosis of benign intracranial hypertension also known as pseudotumor cerebri. She has been followed by gynecologist Dr. Stefano GaulStringer who initially made the diagnosis. She underwent after last visit with Dr. panel mildly in March 2015 a spinal tap which showed entirely normal fluids and cell population. There were no oligoclonal proteins glucose or Gram stain findings. Her opening pressure however confirmed that she had hypertension. It was about 37 cm water pressure and after removal of 30 mL's the closing pressure was 12 cm water and her headaches subsided. The patient reports that she has noticed a correlation between symptoms of benign intracranial hypertension and her body weight. She tries to keep her body weight at 200 pounds if her body weight exceeds this limited she finds herself with new symptoms. She also has maintained on medication. Continues to take Diamox and Topamax.   UPDATE 01/10/14: VP  Since last visit patient ran out of medications in August 2014. Her headaches remain under control. However one to 2 weeks ago patient's headaches began to return. Now she is having "swishing" sound in her ears. Patient saw her eye doctor in February 2015 who noted that her  papilledema had returned. Patient called me last night complaining of severe increasing headaches. I arranged for her to come into the clinic today for urgent work in. Patient tells me that she was doing well on Diamox 500 mg twice a day and Topamax 50 mg twice a day. In terms of her weight, currently she is 221 pounds. She's gained 6 pounds in the last 2 months. Her maximum weight recently was 249 pounds when she was last pregnant. Her lowest weight was 175 pounds in 2011.   PRIOR HPI (11/11/11, Dr. Vickey Hugerohmeier):  25 year old, right handed, African-American female with history of pseudotumor cerebri. She also has history of migraines about 3 years ago. She developed horizontal diplopia along with headaches in June, 2010.  She was seen by her PCP and an optometrist, who diagnosed papilledema, left > right. She had an MRI that was read as normal. She was seen by [facility] Ninet[facility]esfaye [facility] but did not feel that the visit was beneficial. She was [facility] a few days later and had an LP in the ER at the recommendation of Dr Sharene SkeansHickling, who was on call at the time. The opening pressure was 46 mm/H2O. CSF was drained to 15-7017mm/H20. Laboratory studies on the CSF were normal. She reported some relief in her headache and diplopia improved. The patient was started on Diamox and Topiramate at the ER. She was referred to Dr Vickey Hugerohmeier for follow up.  When she was last seen by Dr Vickey Hugerohmeier, her symptoms had improved and her fundoscopic exam showed residual pallor, left > right.  In August, 2011, she was  experiencing nausea after taking her medication. We stopped Topamax, made changes to the timing of her medication and that nausea improved. She also complained of increase in blurred vision, papilledema and pulsatile tinnitus in her right >left ear and the decision was made to refer her for repeat lumbar puncture. That was performed on July 02, 2010. Although she unfortunately experienced post-LP headache  that required blood patch for resolution, she said that the blurred vision and tinnitus improved for about 2 weeks. Then she gradually has had recurrence of both and is frustrated with her ongoing problems. She is fearful about the possibility of frequent lumbar punctures and wonders if there are other options for treatment.  January 15 th 2014 - patient re-referred  by her gynecologist  for migraine after recent pregancy , and she experienced worsening headaches. Headaches  start from the forehead towards the temples and retro-orbital .She has diplopia and blurring of vision. She will need a repeat  LP with OP. she gained weight with the pregancy.   REVIEW OF SYSTEMS: Full 14 system review of systems performed and notable only for those listed, all others are neg:  Constitutional: neg  Cardiovascular: neg Ear/Nose/Throat: neg  Skin: neg Eyes: neg Respiratory: neg Gastroitestinal: neg  Hematology/Lymphatic: neg  Endocrine: neg Musculoskeletal: Back pain neck pain Allergy/Immunology: neg Neurological: Headaches Psychiatric: neg Sleep : neg   ALLERGIES: No Known Allergies  HOME MEDICATIONS: Outpatient Medications Prior to Visit  Medication Sig Dispense Refill  . acetaminophen (TYLENOL) 500 MG tablet Take 500 mg by mouth every 6 (six) hours as needed. Back pain    . acetaZOLAMIDE (DIAMOX) 500 MG capsule Take 1 capsule (500 mg total) by mouth 2 (two) times daily. 180 capsule 3  . ibuprofen (ADVIL,MOTRIN) 200 MG tablet Take 200 mg by mouth every 6 (six) hours as needed.    . topiramate (TOPAMAX) 50 MG tablet Take 1 tablet (50 mg total) by mouth 2 (two) times daily. 180 tablet 3  . albuterol (PROVENTIL HFA;VENTOLIN HFA) 108 (90 BASE) MCG/ACT inhaler Inhale 2 puffs into the lungs every 6 (six) hours as needed. For asthma    . butalbital-acetaminophen-caffeine (FIORICET, ESGIC) 50-325-40 MG per tablet Take 1 tablet by mouth every 8 (eight) hours as needed for headache. (Patient not taking:  Reported on 07/29/2016) 30 tablet 0   No facility-administered medications prior to visit.     PAST MEDICAL HISTORY: Past Medical History:  Diagnosis Date  . Asthma    INHALER PRN;WEATHER INDUCED  . Family history of cleft lip 03/26/2012   Paternal first cousin  . Headache(784.0)    MIGRAINES D/T PSEUDOTUMOR CEREBRI  . NSVD (normal spontaneous vaginal delivery) 10/01/2012  . Pseudotumor cerebri 07/2009   HAS HAD 2 SPINAL TAPS AND BLOOD PATCH IN 2010 &9/ 2011  . Weight gain    WT FLUCTUATES D/T PSEUDOTUMOR CEREBRI    PAST SURGICAL HISTORY: Past Surgical History:  Procedure Laterality Date  . WISDOM TOOTH EXTRACTION  2007   ALL 4 EXTRACTED    FAMILY HISTORY: Family History  Problem Relation Age of Onset  . Heart disease Paternal Grandmother     CHF  . Stroke Paternal Grandmother   . Other Maternal Aunt     VARICOSE VEINS  . Other Maternal Grandmother     VARICOSE VEINS  . Cancer Maternal Grandmother     BREAST TO BONES  . Seizures Cousin     MATERANL;EPILEPSY  . Seizures Maternal Aunt     MATERNAL  . Migraines  Maternal Aunt   . Hypertension Mother   . Hypertension Father   . Diabetes Father     ORAL MEDS  . Hypertension Maternal Aunt     2 AUNTS  . Hypertension Maternal Uncle     1 UNCLE  . Hypertension Paternal Aunt     4 AUNTS  . Hypertension Paternal Uncle     1 UNCLE  . Hypertension Brother   . Diabetes Brother     IDDM  . Drug abuse Maternal Uncle   . Other Paternal Uncle     NICOTINE;MJ USE    SOCIAL HISTORY: Social History   Social History  . Marital status: Single    Spouse name: N/A  . Number of children: 1  . Years of education: 33   Occupational History  . COOK/SERVER [employer]   Social History Main Topics  . Smoking status: Former Smoker    Types: Cigars  . Smokeless tobacco: Never Used     Comment: PT DID NOT SMOKE EVERY DAY  . Alcohol use No     Comment: SOCIAL DRINKER IN PAST  . Drug use: No  . Sexual activity: Yes     Partners: Male    Birth control/ protection: Pill     Comment: spintec    Other Topics Concern  . Not on file   Social History Narrative   Patient lives at home with family.   Caffeine Use: 1 soda daily     PHYSICAL EXAM  Vitals:   07/29/16 0819  BP: 136/68  Pulse: (!) 104  Weight: 226 lb 6.4 oz (102.7 kg)  Height: 5' 5.35" (1.66 m)   Body mass index is 37.27 kg/m.  Generalized: Well developed, Obese female in no acute distress  Head: normocephalic and atraumatic,. Oropharynx benign  Neck: Supple, no carotid bruits  Cardiac: Regular rate rhythm, no murmur  Musculoskeletal: No deformity   Neurological examination   Mentation: Alert oriented to time, place, history taking. Attention span and concentration appropriate. Recent and remote memory intact.  Follows all commands speech and language fluent.   Cranial nerve II-XII: Fundoscopic exam reveals blurred disc margins .Pupils were equal round reactive to light extraocular movements were full, visual field were full on confrontational test. Facial sensation and strength were normal. hearing was intact to finger rubbing bilaterally. Uvula tongue midline. head turning and shoulder shrug were normal and symmetric.Tongue protrusion into cheek strength was normal. Motor: normal bulk and tone, full strength in the BUE, BLE, fine finger movements normal, no pronator drift. No focal weakness Sensory: normal and symmetric to light touch, pinprick, and  Vibration, in the upper and lower extremities Coordination: finger-nose-finger, heel-to-shin bilaterally, no dysmetria Reflexes: Brachioradialis 2/2, biceps 2/2, triceps 2/2, patellar 2/2, Achilles 2/2, plantar responses were flexor bilaterally. Gait and Station: Rising up from seated position without assistance, normal stance,  moderate stride, good arm swing, smooth turning, able to perform tiptoe, and heel walking without difficulty. Tandem gait is steady  DIAGNOSTIC DATA (LABS,  IMAGING, TESTING) -    ASSESSMENT AND PLAN  25 y.o. year old female  has a past medical history of Headache(784.0); Pseudotumor cerebri (07/2009); and Weight gain. here to follow-up. She has been off of her medications for about a month and a half her headaches have returned. Records reviewed   PLAN: Continue Diamox at current dose will refill Continue Topamax 50 mg twice daily Follow-up yearly and when necessary Nilda Riggs, [facility], [facility], APRN  [facility] [address],  [place], [place] [address] [phone]

## 2016-07-29 NOTE — Progress Notes (Signed)
I agree with the assessment and plan as directed by NP .The patient is known to me .   Anmol Fleck, MD  

## 2016-07-30 ENCOUNTER — Telehealth: Payer: Self-pay | Admitting: *Deleted

## 2016-07-30 NOTE — Telephone Encounter (Signed)
Fax confirmation received.  [facility], for [employer]

## 2016-07-31 ENCOUNTER — Encounter: Payer: Self-pay | Admitting: *Deleted

## 2016-07-31 NOTE — Progress Notes (Signed)
07/31/16 [facility] that  patient received Fluarix Quad 2017-18 injection on 07/30/16.

## 2017-08-04 ENCOUNTER — Ambulatory Visit: Payer: BLUE CROSS/BLUE SHIELD | Admitting: Nurse Practitioner

## 2023-07-24 ENCOUNTER — Other Ambulatory Visit: Payer: Self-pay | Admitting: Obstetrics and Gynecology

## 2023-07-28 ENCOUNTER — Encounter (HOSPITAL_BASED_OUTPATIENT_CLINIC_OR_DEPARTMENT_OTHER): Payer: Self-pay | Admitting: Obstetrics and Gynecology

## 2023-07-29 ENCOUNTER — Encounter (HOSPITAL_BASED_OUTPATIENT_CLINIC_OR_DEPARTMENT_OTHER): Payer: Self-pay | Admitting: Obstetrics and Gynecology

## 2023-07-30 ENCOUNTER — Encounter (HOSPITAL_BASED_OUTPATIENT_CLINIC_OR_DEPARTMENT_OTHER): Payer: Self-pay | Admitting: Obstetrics and Gynecology

## 2023-07-30 NOTE — Progress Notes (Signed)
Spoke w/ via phone for pre-op interview--- pt Lab needs dos---- cbc, cmp, t&s, urine preg, ekg        Lab results------ no COVID test -----patient states asymptomatic no test needed Arrive at ------- 1115 on 08-07-2023 NPO after MN NO Solid Food.  Clear liquids from MN until--- 1015 Med rec completed Medications to take morning of surgery ----- norvasc, labetalol Diabetic medication ----- n/a Patient instructed no nail polish to be worn day of surgery Patient instructed to bring photo id and insurance card day of surgery Patient aware to have Driver (ride ) / caregiver for 24 hours after surgery - mother, annette Patient Special Instructions ----- n/a Pre-Op special Instructions ----- pre-op orders need second sign Patient verbalized understanding of instructions that were given at this phone interview. Patient denies chest pain, sob, fever, cough at the interview.

## 2023-08-06 ENCOUNTER — Encounter (HOSPITAL_COMMUNITY): Payer: Self-pay | Admitting: Anesthesiology

## 2023-08-06 NOTE — Anesthesia Preprocedure Evaluation (Signed)
Anesthesia Evaluation  Patient identified by MRN, date of birth, ID band Patient awake    Reviewed: Allergy & Precautions, NPO status , Patient's Chart, lab work & pertinent test results  History of Anesthesia Complications (+) Family history of anesthesia reaction and history of anesthetic complications (mother with PONV)  Airway        Dental   Pulmonary asthma , former smoker          Cardiovascular      Neuro/Psych  Headaches Benign intracranial HTN    GI/Hepatic   Endo/Other    Morbid obesityprediabetes  Renal/GU      Musculoskeletal   Abdominal   Peds  Hematology   Anesthesia Other Findings   Reproductive/Obstetrics                              Anesthesia Physical Anesthesia Plan  ASA: 3  Anesthesia Plan: General   Post-op Pain Management: Tylenol PO (pre-op)*   Induction: Intravenous  PONV Risk Score and Plan: 3 and Ondansetron, Dexamethasone, Midazolam and Treatment may vary due to age or medical condition  Airway Management Planned: Oral ETT  Additional Equipment:   Intra-op Plan:   Post-operative Plan: Extubation in OR  Informed Consent:      Dental advisory given  Plan Discussed with: CRNA and Anesthesiologist  Anesthesia Plan Comments: (Risks of general anesthesia discussed including, but not limited to, sore throat, hoarse voice, chipped/damaged teeth, injury to vocal cords, nausea and vomiting, allergic reactions, lung infection, heart attack, stroke, and death. All questions answered. )         Anesthesia Quick Evaluation

## 2023-08-07 ENCOUNTER — Inpatient Hospital Stay (HOSPITAL_COMMUNITY): Payer: BC Managed Care – PPO | Admitting: Anesthesiology

## 2023-08-07 ENCOUNTER — Encounter (HOSPITAL_COMMUNITY): Payer: Self-pay | Admitting: Obstetrics and Gynecology

## 2023-08-07 ENCOUNTER — Ambulatory Visit (HOSPITAL_COMMUNITY)
Admission: RE | Admit: 2023-08-07 | Discharge: 2023-08-07 | Disposition: A | Discharge status: Home / Self Care | Payer: BC Managed Care – PPO | Attending: Obstetrics and Gynecology | Admitting: Obstetrics and Gynecology

## 2023-08-07 ENCOUNTER — Other Ambulatory Visit: Payer: Self-pay

## 2023-08-07 ENCOUNTER — Encounter (HOSPITAL_COMMUNITY)
Admission: RE | Disposition: A | Discharge status: Home / Self Care | Payer: Self-pay | Source: Home / Self Care | Attending: Obstetrics and Gynecology

## 2023-08-07 DIAGNOSIS — N939 Abnormal uterine and vaginal bleeding, unspecified: Secondary | ICD-10-CM | POA: Diagnosis present

## 2023-08-07 DIAGNOSIS — Z87891 Personal history of nicotine dependence: Secondary | ICD-10-CM | POA: Insufficient documentation

## 2023-08-07 DIAGNOSIS — R7303 Prediabetes: Secondary | ICD-10-CM | POA: Diagnosis not present

## 2023-08-07 DIAGNOSIS — I10 Essential (primary) hypertension: Secondary | ICD-10-CM | POA: Insufficient documentation

## 2023-08-07 DIAGNOSIS — Z01818 Encounter for other preprocedural examination: Secondary | ICD-10-CM

## 2023-08-07 HISTORY — DX: Abnormal uterine and vaginal bleeding, unspecified: N93.9

## 2023-08-07 HISTORY — DX: Benign intracranial hypertension: G93.2

## 2023-08-07 HISTORY — DX: Migraine without aura, not intractable, without status migrainosus: G43.009

## 2023-08-07 HISTORY — PX: HYSTEROSCOPY WITH D & C: SHX1775

## 2023-08-07 HISTORY — DX: Personal history of other infectious and parasitic diseases: Z86.19

## 2023-08-07 HISTORY — DX: Prediabetes: R73.03

## 2023-08-07 HISTORY — DX: Family history of other specified conditions: Z84.89

## 2023-08-07 LAB — COMPREHENSIVE METABOLIC PANEL
ALT: 31 U/L (ref 0–44)
AST: 27 U/L (ref 15–41)
Albumin: 3.7 g/dL (ref 3.5–5.0)
Alkaline Phosphatase: 25 U/L — ABNORMAL LOW (ref 38–126)
Anion gap: 13 (ref 5–15)
BUN: 11 mg/dL (ref 6–20)
CO2: 24 mmol/L (ref 22–32)
Calcium: 9.3 mg/dL (ref 8.9–10.3)
Chloride: 103 mmol/L (ref 98–111)
Creatinine, Ser: 0.78 mg/dL (ref 0.44–1.00)
GFR, Estimated: >60 mL/min (ref >60)
Glucose, Bld: 111 mg/dL — ABNORMAL HIGH (ref 70–99)
Potassium: 3.1 mmol/L — ABNORMAL LOW (ref 3.5–5.1)
Sodium: 140 mmol/L (ref 135–145)
Total Bilirubin: 0.4 mg/dL (ref 0.3–1.2)
Total Protein: 7.2 g/dL (ref 6.5–8.1)

## 2023-08-07 LAB — CBC
HCT: 33.8 % — ABNORMAL LOW (ref 36.0–46.0)
Hemoglobin: 11.3 g/dL — ABNORMAL LOW (ref 12.0–15.0)
MCH: 28.6 pg (ref 26.0–34.0)
MCHC: 33.4 g/dL (ref 30.0–36.0)
MCV: 85.6 fL (ref 80.0–100.0)
Platelets: 486 10*3/uL — ABNORMAL HIGH (ref 150–400)
RBC: 3.95 MIL/uL (ref 3.87–5.11)
RDW: 12.9 % (ref 11.5–15.5)
WBC: 9.6 10*3/uL (ref 4.0–10.5)
nRBC: 0 % (ref 0.0–0.2)

## 2023-08-07 LAB — TYPE AND SCREEN
ABO/RH(D): A POS
Antibody Screen: NEGATIVE

## 2023-08-07 LAB — POCT PREGNANCY, URINE: Preg Test, Ur: NEGATIVE

## 2023-08-07 SURGERY — DILATATION AND CURETTAGE /HYSTEROSCOPY
Anesthesia: General | Site: Vagina

## 2023-08-07 SURGERY — DILATATION AND CURETTAGE /HYSTEROSCOPY
Anesthesia: General

## 2023-08-07 MED ORDER — MIDAZOLAM HCL 2 MG/2ML IJ SOLN
INTRAMUSCULAR | Status: DC | PRN
  Administered 2023-08-07: 2 mg via INTRAVENOUS

## 2023-08-07 MED ORDER — LIDOCAINE 2% (20 MG/ML) 5 ML SYRINGE
INTRAMUSCULAR | Status: DC | PRN
  Administered 2023-08-07: 80 mg via INTRAVENOUS

## 2023-08-07 MED ORDER — ONDANSETRON HCL 4 MG/2ML IJ SOLN
INTRAMUSCULAR | Status: DC | PRN
  Administered 2023-08-07: 4 mg via INTRAVENOUS

## 2023-08-07 MED ORDER — LACTATED RINGERS IV SOLN: INTRAVENOUS | Status: DC

## 2023-08-07 MED ORDER — PROPOFOL 10 MG/ML IV BOLUS
INTRAVENOUS | Status: DC | PRN
  Administered 2023-08-07: 200 mg via INTRAVENOUS

## 2023-08-07 MED ORDER — MIDAZOLAM HCL 2 MG/2ML IJ SOLN
INTRAMUSCULAR | Status: AC
  Filled 2023-08-07: qty 2

## 2023-08-07 MED ORDER — OXYCODONE HCL 5 MG PO TABS
5.0000 mg | ORAL_TABLET | Freq: Four times a day (QID) | ORAL | 0 refills | Status: AC | PRN
Start: 2023-08-07 — End: ?

## 2023-08-07 MED ORDER — DEXAMETHASONE SODIUM PHOSPHATE 10 MG/ML IJ SOLN
INTRAMUSCULAR | Status: DC | PRN
  Administered 2023-08-07: 8 mg via INTRAVENOUS

## 2023-08-07 MED ORDER — KETOROLAC TROMETHAMINE 30 MG/ML IJ SOLN
INTRAMUSCULAR | Status: AC
  Filled 2023-08-07: qty 1

## 2023-08-07 MED ORDER — CHLORHEXIDINE GLUCONATE 0.12 % MT SOLN
15.0000 mL | Freq: Once | OROMUCOSAL | Status: AC
  Administered 2023-08-07: 15 mL via OROMUCOSAL
  Filled 2023-08-07: qty 15

## 2023-08-07 MED ORDER — LIDOCAINE HCL 1 % IJ SOLN
INTRAMUSCULAR | Status: DC | PRN
  Administered 2023-08-07: 10 mL

## 2023-08-07 MED ORDER — POVIDONE-IODINE 10 % EX SWAB
2.0000 | Freq: Once | CUTANEOUS | Status: AC
  Administered 2023-08-07: 2 via TOPICAL

## 2023-08-07 MED ORDER — SODIUM CHLORIDE 0.9 % IV SOLN
INTRAVENOUS | Status: DC | PRN
Start: 2023-08-07 — End: 2023-08-07

## 2023-08-07 MED ORDER — CEFAZOLIN IN SODIUM CHLORIDE 3-0.9 GM/100ML-% IV SOLN
3.0000 g | INTRAVENOUS | Status: DC
  Filled 2023-08-07: qty 100

## 2023-08-07 MED ORDER — LIDOCAINE HCL 1 % IJ SOLN
INTRAMUSCULAR | Status: AC
  Filled 2023-08-07: qty 20

## 2023-08-07 MED ORDER — ONDANSETRON HCL 4 MG/2ML IJ SOLN
INTRAMUSCULAR | Status: AC
  Filled 2023-08-07: qty 2

## 2023-08-07 MED ORDER — FENTANYL CITRATE (PF) 250 MCG/5ML IJ SOLN
INTRAMUSCULAR | Status: DC | PRN
  Administered 2023-08-07: 50 ug via INTRAVENOUS
  Administered 2023-08-07: 100 ug via INTRAVENOUS

## 2023-08-07 MED ORDER — ORAL CARE MOUTH RINSE: 15.0000 mL | Freq: Once | OROMUCOSAL | Status: AC

## 2023-08-07 MED ORDER — CEFAZOLIN SODIUM-DEXTROSE 2-3 GM-%(50ML) IV SOLR
INTRAVENOUS | Status: DC | PRN
Start: 2023-08-07 — End: 2023-08-07
  Administered 2023-08-07: 2 g via INTRAVENOUS

## 2023-08-07 MED ORDER — PROPOFOL 10 MG/ML IV BOLUS
INTRAVENOUS | Status: AC
  Filled 2023-08-07: qty 20

## 2023-08-07 MED ORDER — LIDOCAINE 2% (20 MG/ML) 5 ML SYRINGE
INTRAMUSCULAR | Status: AC
  Filled 2023-08-07: qty 5

## 2023-08-07 MED ORDER — SCOPOLAMINE 1 MG/3DAYS TD PT72
1.0000 | MEDICATED_PATCH | TRANSDERMAL | Status: DC
  Administered 2023-08-07: 1.5 mg via TRANSDERMAL
  Filled 2023-08-07: qty 1

## 2023-08-07 MED ORDER — SODIUM CHLORIDE 0.9 % IR SOLN
Status: DC | PRN
  Administered 2023-08-07: 1000 mL

## 2023-08-07 MED ORDER — ACETAMINOPHEN 500 MG PO TABS
1000.0000 mg | ORAL_TABLET | Freq: Once | ORAL | Status: AC
  Administered 2023-08-07: 1000 mg via ORAL
  Filled 2023-08-07: qty 2

## 2023-08-07 MED ORDER — DEXAMETHASONE SODIUM PHOSPHATE 10 MG/ML IJ SOLN
INTRAMUSCULAR | Status: AC
  Filled 2023-08-07: qty 1

## 2023-08-07 MED ORDER — FENTANYL CITRATE (PF) 250 MCG/5ML IJ SOLN
INTRAMUSCULAR | Status: AC
  Filled 2023-08-07: qty 5

## 2023-08-07 MED ORDER — IBUPROFEN 600 MG PO TABS: 600.0000 mg | ORAL_TABLET | Freq: Four times a day (QID) | ORAL | 1 refills | Status: AC | PRN

## 2023-08-07 SURGICAL SUPPLY — 14 items
CATH ROBINSON RED A/P 16FR (CATHETERS) ×1 IMPLANT
ELECT COAG BIPOL BALL 21FR (ELECTRODE) IMPLANT
ELECT REM PT RETURN 9FT ADLT (ELECTROSURGICAL)
ELECTRODE REM PT RTRN 9FT ADLT (ELECTROSURGICAL) IMPLANT
GLOVE BIO SURGEON STRL SZ7.5 (GLOVE) ×1 IMPLANT
GLOVE BIOGEL PI IND STRL 7.5 (GLOVE) ×1 IMPLANT
GLOVE SURG UNDER POLY LF SZ7 (GLOVE) ×1 IMPLANT
GOWN STRL REUS W/ TWL LRG LVL3 (GOWN DISPOSABLE) ×2 IMPLANT
GOWN STRL REUS W/TWL LRG LVL3 (GOWN DISPOSABLE) ×2
KIT PROCEDURE FLUENT (KITS) ×1 IMPLANT
LOOP CUTTING BIPOLAR 21FR (ELECTRODE) IMPLANT
PACK VAGINAL MINOR WOMEN LF (CUSTOM PROCEDURE TRAY) ×1 IMPLANT
PAD OB MATERNITY 4.3X12.25 (PERSONAL CARE ITEMS) ×1 IMPLANT
TOWEL GREEN STERILE FF (TOWEL DISPOSABLE) ×2 IMPLANT

## 2023-08-07 NOTE — Anesthesia Preprocedure Evaluation (Addendum)
Anesthesia Evaluation  Patient identified by MRN, date of birth, ID band Patient awake    Reviewed: Allergy & Precautions, NPO status , Patient's Chart, lab work & pertinent test results, reviewed documented beta blocker date and time   History of Anesthesia Complications (+) Family history of anesthesia reaction and history of anesthetic complications (maternal ponv)  Airway Mallampati: II  TM Distance: >3 FB Neck ROM: Full    Dental  (+) Dental Advisory Given   Pulmonary asthma (well controlled, no inhaler) , former smoker   breath sounds clear to auscultation       Cardiovascular hypertension, Pt. on medications and Pt. on home beta blockers  Rhythm:Regular     Neuro/Psych  Headaches Pseudotumor cerebri 2017, no recent issues  negative psych ROS   GI/Hepatic negative GI ROS, Neg liver ROS,,,  Endo/Other  diabetes (prediabetic)  Morbid obesity  Renal/GU negative Renal ROS  negative genitourinary   Musculoskeletal negative musculoskeletal ROS (+)    Abdominal  (+) + obese  Peds  Hematology  (+) Blood dyscrasia, anemia Lab Results      Component                Value               Date                      WBC                      9.6                 08/07/2023                HGB                      11.3 (L)            08/07/2023                HCT                      33.8 (L)            08/07/2023                MCV                      85.6                08/07/2023                PLT                      486 (H)             08/07/2023              Anesthesia Other Findings   Reproductive/Obstetrics Abnormal uterine bleeding                             Anesthesia Physical Anesthesia Plan  ASA: 3  Anesthesia Plan: General   Post-op Pain Management: Tylenol PO (pre-op)* and Toradol IV (intra-op)*   Induction: Intravenous  PONV Risk Score and Plan: 3 and Treatment may vary due to  age or medical condition, Ondansetron, Dexamethasone and Midazolam  Airway Management Planned: LMA  Additional Equipment: None  Intra-op  Plan:   Post-operative Plan: Extubation in OR  Informed Consent:   Plan Discussed with:   Anesthesia Plan Comments:        Anesthesia Quick Evaluation

## 2023-08-07 NOTE — Transfer of Care (Signed)
Immediate Anesthesia Transfer of Care Note  Patient: Daisy Werner  Procedure(s) Performed: DILATATION AND CURETTAGE /HYSTEROSCOPY (Vagina )  Patient Location: PACU  Anesthesia Type:General  Level of Consciousness: awake and alert   Airway & Oxygen Therapy: Patient Spontanous Breathing and Patient connected to face mask oxygen  Post-op Assessment: Report given to RN and Post -op Vital signs reviewed and stable  Post vital signs: Reviewed and stable  Last Vitals:  Vitals Value Taken Time  BP 147/98 08/07/23 1425  Temp    Pulse 87 08/07/23 1426  Resp 19 08/07/23 1426  SpO2 100 % 08/07/23 1426  Vitals shown include unfiled device data.  Last Pain:  Vitals:   08/07/23 1105  TempSrc:   PainSc: 0-No pain         Complications: No notable events documented.

## 2023-08-07 NOTE — H&P (Signed)
Daisy Werner is an 32 y.o. female. Pt presenting today for hysteroscopy D&C d/t abnormal uterine bleeding not well controlled on hormonal mgmt.  Pertinent Gynecological History:  OB History: G1, P1001   Menstrual History: LMP 04/21/23  No LMP recorded. (Menstrual status: Irregular Periods).    Past Medical History:  Diagnosis Date   Abnormal uterine bleeding (AUB)    Asthma    07-30-2023  per pt last used rescue 2 yrs ago,  currently does not have an inhaler   Benign intracranial hypertension    07-30-2023  per pt followed by pcp but has not had any issues past few years,  last seen by neurologist--- dr Vickey Huger 08-08-2016 note in epic   Family history of adverse reaction to anesthesia    mother--- ponv   Family history of cleft lip 03/26/2012   Paternal first cousin   History of trichomoniasis    Migraine without aura, not intractable    MIGRAINES D/T PSEUDOTUMOR CEREBRI   (07-30-2023  per pt no migraine for past couple yrs)   Pre-diabetes    Pseudotumor cerebri 07/2009   HAS HAD 2 SPINAL TAPS AND BLOOD PATCH IN 2010 &9/ 2011  (previouly followed by dr dohmeier, neuology)    Past Surgical History:  Procedure Laterality Date   NO PAST SURGERIES     WISDOM TOOTH EXTRACTION  2008    Family History  Problem Relation Age of Onset   Heart disease Paternal Grandmother        CHF   Stroke Paternal Grandmother    Other Maternal Aunt        VARICOSE VEINS   Other Maternal Grandmother        VARICOSE VEINS   Cancer Maternal Grandmother        BREAST TO BONES   Seizures Cousin        MATERANL;EPILEPSY   Seizures Maternal Aunt        MATERNAL   Migraines Maternal Aunt    Hypertension Mother    Hypertension Father    Diabetes Father        ORAL MEDS   Hypertension Maternal Aunt        2 AUNTS   Hypertension Maternal Uncle        1 UNCLE   Hypertension Paternal Aunt        4 AUNTS   Hypertension Paternal Uncle        1 UNCLE   Hypertension Brother    Diabetes  Brother        IDDM   Drug abuse Maternal Uncle    Other Paternal Uncle        NICOTINE;MJ USE    Social History:  reports that she has quit smoking. Her smoking use included cigarettes. She has never used smokeless tobacco. She reports current alcohol use. She reports that she does not use drugs.  Allergies: No Known Allergies  Medications Prior to Admission  Medication Sig Dispense Refill Last Dose   acetaminophen (TYLENOL) 500 MG tablet Take 500 mg by mouth every 6 (six) hours as needed. Back pain   08/06/2023   amLODipine (NORVASC) 10 MG tablet Take 10 mg by mouth daily.   08/07/2023 at 0830   hydrochlorothiazide (HYDRODIURIL) 25 MG tablet Take 25 mg by mouth 2 (two) times daily.   08/06/2023   Iron Carbonyl-Vitamin C-FOS (CHEWABLE IRON PO) Take by mouth daily.   Past Week   labetalol (NORMODYNE) 100 MG tablet Take 100 mg by mouth 2 (two) times  daily.   08/07/2023 at 0830    Review of Systems Denies F/C/N/V/D  Blood pressure 135/81, pulse 97, temperature 98.3 F (36.8 C), temperature source Oral, resp. rate 20, height 5\' 5"  (1.651 m), weight (!) 142.4 kg, SpO2 98%. Physical Exam  Lungs unlabored CV RRR Abdomen soft, NT Extremities no calf tenderness  Results for orders placed or performed during the hospital encounter of 08/07/23 (from the past 24 hour(s))  Type and screen     Status: None   Collection Time: 08/07/23 11:00 AM  Result Value Ref Range   ABO/RH(D) A POS    Antibody Screen NEG    Sample Expiration      08/10/2023,2359 Performed at [facility], [address] [place], [name] [address]   CBC     Status: Abnormal   Collection Time: 08/07/23 11:09 AM  Result Value Ref Range   WBC 9.6 4.0 - 10.5 K/uL   RBC 3.95 3.87 - 5.11 MIL/uL   Hemoglobin 11.3 (L) 12.0 - 15.0 g/dL   HCT 62.1 (L) 30.8 - 65.7 %   MCV 85.6 80.0 - 100.0 fL   MCH 28.6 26.0 - 34.0 pg   MCHC 33.4 30.0 - 36.0 g/dL   RDW 84.6 96.2 - 95.2 %   Platelets 486 (H) 150 - 400 K/uL   nRBC  0.0 0.0 - 0.2 %  Comprehensive metabolic panel     Status: Abnormal   Collection Time: 08/07/23 11:09 AM  Result Value Ref Range   Sodium 140 135 - 145 mmol/L   Potassium 3.1 (L) 3.5 - 5.1 mmol/L   Chloride 103 98 - 111 mmol/L   CO2 24 22 - 32 mmol/L   Glucose, Bld 111 (H) 70 - 99 mg/dL   BUN 11 6 - 20 mg/dL   Creatinine, Ser 8.41 0.44 - 1.00 mg/dL   Calcium 9.3 8.9 - 32.4 mg/dL   Total Protein 7.2 6.5 - 8.1 g/dL   Albumin 3.7 3.5 - 5.0 g/dL   AST 27 15 - 41 U/L   ALT 31 0 - 44 U/L   Alkaline Phosphatase 25 (L) 38 - 126 U/L   Total Bilirubin 0.4 0.3 - 1.2 mg/dL   GFR, Estimated >40 >10 mL/min   Anion gap 13 5 - 15    No results found.  Assessment/Plan: 32yo G1P1001 presenting for hysteroscopy D&C d/t abnormal uterine bleeding.  Risks benefites and alternatives reviewed with the patient including but not limited to bleeding infection and injury.  Questions answered and consent signed and witnessed.  ALESANDRA SMART 08/07/2023, 1:19 PM

## 2023-08-07 NOTE — Op Note (Addendum)
Preop Diagnosis: Abnormal Uterine Bleeding   Postop Diagnosis: Abnormal Uterine Bleeding  Procedure: DILATATION AND CURETTAGE /HYSTEROSCOPY   Anesthesia: General   Anesthesiologist: See Flow Sheet   Attending: Osborn Coho, MD   Assistant: N/a  Findings: No obvious intracavitary lesions  Pathology: Endometrial Curettings  Fluids: 150 cc  UOP: 100 cc  EBL: 5 cc  Complications: None  Procedure: The patient was taken to the operating room after the risks, benefits and alternatives were discussed with the patient. The patient verbalized understanding and consent signed and witnessed. The patient was placed under general anesthesia per anesthesiologist and prepped and draped in the normal sterile fashion.  Time Out was performed per protocol.  A bivalve speculum was placed in the patient's vagina and the anterior lip of the cervix was grasped with a single tooth tenaculum. A paracervical block was administered using a total of 10 cc of 1% lidocaine. The uterus sounded to 8.5 cm. The cervix was dilated for passage of the hysteroscope.  The hysteroscope was introduced into the uterine cavity and findings as noted above. Sharp curettage was performed until a gritty texture was noted and curettings were sent to pathology. The hysteroscope was reintroduced and no obvious remaining intracavitary lesions were noted.  All instruments were removed. Sponge lap and needle count was correct. The patient tolerated the procedure well and was returned to the recovery room in good condition.

## 2023-08-07 NOTE — Anesthesia Procedure Notes (Signed)
Procedure Name: LMA Insertion Date/Time: 08/07/2023 1:43 PM  Performed by: April Holding, CRNAPre-anesthesia Checklist: Patient identified, Emergency Drugs available, Suction available and Patient being monitored Patient Re-evaluated:Patient Re-evaluated prior to induction Oxygen Delivery Method: Circle System Utilized Preoxygenation: Pre-oxygenation with 100% oxygen Induction Type: IV induction Ventilation: Mask ventilation without difficulty LMA: LMA inserted LMA Size: 4.0 Number of attempts: 1 Airway Equipment and Method: Bite block Placement Confirmation: positive ETCO2 Tube secured with: Tape Dental Injury: Teeth and Oropharynx as per pre-operative assessment

## 2023-08-10 ENCOUNTER — Encounter (HOSPITAL_COMMUNITY): Payer: Self-pay | Admitting: Obstetrics and Gynecology

## 2023-08-10 LAB — SURGICAL PATHOLOGY

## 2023-08-13 ENCOUNTER — Telehealth: Payer: Self-pay

## 2023-08-13 NOTE — Telephone Encounter (Signed)
Left patient a message regarding rescheduled appointment due to Provider being ON CALL on 10/24; left call back number for New Patient specialist if having further questions

## 2023-08-13 NOTE — Anesthesia Postprocedure Evaluation (Signed)
Anesthesia Post Note  Patient: Daisy Werner  Procedure(s) Performed: DILATATION AND CURETTAGE /HYSTEROSCOPY (Vagina )     Patient location during evaluation: PACU Anesthesia Type: General Level of consciousness: awake and alert Pain management: pain level controlled Vital Signs Assessment: post-procedure vital signs reviewed and stable Respiratory status: spontaneous breathing, nonlabored ventilation and respiratory function stable Cardiovascular status: blood pressure returned to baseline and stable Postop Assessment: no apparent nausea or vomiting Anesthetic complications: no   No notable events documented.                 Davis Vannatter

## 2023-08-20 ENCOUNTER — Encounter: Payer: BC Managed Care – PPO | Admitting: Oncology

## 2023-08-20 ENCOUNTER — Inpatient Hospital Stay: Payer: BC Managed Care – PPO

## 2023-08-25 ENCOUNTER — Inpatient Hospital Stay: Payer: BC Managed Care – PPO

## 2023-10-27 ENCOUNTER — Telehealth: Payer: Self-pay | Admitting: Internal Medicine

## 2023-10-27 NOTE — Telephone Encounter (Signed)
Rescheduled appointments per patients request. Patient is aware of the changes made to her upcoming appointments.

## 2023-10-30 ENCOUNTER — Inpatient Hospital Stay: Payer: BC Managed Care – PPO

## 2023-11-23 ENCOUNTER — Other Ambulatory Visit: Payer: Self-pay

## 2023-11-23 ENCOUNTER — Inpatient Hospital Stay: Payer: BC Managed Care – PPO | Attending: Internal Medicine | Admitting: Internal Medicine

## 2023-11-23 ENCOUNTER — Telehealth: Payer: Self-pay

## 2023-11-23 ENCOUNTER — Inpatient Hospital Stay: Payer: BC Managed Care – PPO

## 2023-11-23 ENCOUNTER — Telehealth: Payer: Self-pay | Admitting: Medical Oncology

## 2023-11-23 ENCOUNTER — Other Ambulatory Visit: Payer: Self-pay | Admitting: Medical Oncology

## 2023-11-23 VITALS — BP 112/67 | HR 95 | Temp 97.9°F | Resp 17 | Ht 65.0 in | Wt 306.4 lb

## 2023-11-23 DIAGNOSIS — D649 Anemia, unspecified: Secondary | ICD-10-CM | POA: Diagnosis present

## 2023-11-23 DIAGNOSIS — Z803 Family history of malignant neoplasm of breast: Secondary | ICD-10-CM | POA: Diagnosis not present

## 2023-11-23 DIAGNOSIS — N920 Excessive and frequent menstruation with regular cycle: Secondary | ICD-10-CM | POA: Diagnosis not present

## 2023-11-23 DIAGNOSIS — J45909 Unspecified asthma, uncomplicated: Secondary | ICD-10-CM | POA: Insufficient documentation

## 2023-11-23 DIAGNOSIS — N83202 Unspecified ovarian cyst, left side: Secondary | ICD-10-CM | POA: Insufficient documentation

## 2023-11-23 DIAGNOSIS — R7303 Prediabetes: Secondary | ICD-10-CM | POA: Diagnosis not present

## 2023-11-23 DIAGNOSIS — I10 Essential (primary) hypertension: Secondary | ICD-10-CM | POA: Diagnosis not present

## 2023-11-23 DIAGNOSIS — N83201 Unspecified ovarian cyst, right side: Secondary | ICD-10-CM | POA: Insufficient documentation

## 2023-11-23 DIAGNOSIS — D500 Iron deficiency anemia secondary to blood loss (chronic): Secondary | ICD-10-CM | POA: Diagnosis not present

## 2023-11-23 DIAGNOSIS — Z87891 Personal history of nicotine dependence: Secondary | ICD-10-CM | POA: Insufficient documentation

## 2023-11-23 DIAGNOSIS — G932 Benign intracranial hypertension: Secondary | ICD-10-CM | POA: Insufficient documentation

## 2023-11-23 LAB — CMP (CANCER CENTER ONLY)
ALT: 24 U/L (ref 0–44)
AST: 23 U/L (ref 15–41)
Albumin: 4.2 g/dL (ref 3.5–5.0)
Alkaline Phosphatase: 23 U/L — ABNORMAL LOW (ref 38–126)
Anion gap: 8 (ref 5–15)
BUN: 11 mg/dL (ref 6–20)
CO2: 28 mmol/L (ref 22–32)
Calcium: 9.8 mg/dL (ref 8.9–10.3)
Chloride: 101 mmol/L (ref 98–111)
Creatinine: 0.79 mg/dL (ref 0.44–1.00)
GFR, Estimated: >60 mL/min (ref >60)
Glucose, Bld: 101 mg/dL — ABNORMAL HIGH (ref 70–99)
Potassium: 2.7 mmol/L — CL (ref 3.5–5.1)
Sodium: 137 mmol/L (ref 135–145)
Total Bilirubin: 0.4 mg/dL (ref 0.0–1.2)
Total Protein: 7.8 g/dL (ref 6.5–8.1)

## 2023-11-23 LAB — IRON AND IRON BINDING CAPACITY (CC-WL,HP ONLY)
Iron: 43 ug/dL (ref 28–170)
Saturation Ratios: 13 % (ref 10.4–31.8)
TIBC: 340 ug/dL (ref 250–450)
UIBC: 297 ug/dL (ref 148–442)

## 2023-11-23 LAB — CBC WITH DIFFERENTIAL (CANCER CENTER ONLY)
Abs Immature Granulocytes: 0.03 10*3/uL (ref 0.00–0.07)
Basophils Absolute: 0.1 10*3/uL (ref 0.0–0.1)
Basophils Relative: 1 %
Eosinophils Absolute: 0.3 10*3/uL (ref 0.0–0.5)
Eosinophils Relative: 2 %
HCT: 34.9 % — ABNORMAL LOW (ref 36.0–46.0)
Hemoglobin: 11.9 g/dL — ABNORMAL LOW (ref 12.0–15.0)
Immature Granulocytes: 0 %
Lymphocytes Relative: 20 %
Lymphs Abs: 2.4 10*3/uL (ref 0.7–4.0)
MCH: 28.4 pg (ref 26.0–34.0)
MCHC: 34.1 g/dL (ref 30.0–36.0)
MCV: 83.3 fL (ref 80.0–100.0)
Monocytes Absolute: 0.9 10*3/uL (ref 0.1–1.0)
Monocytes Relative: 8 %
Neutro Abs: 8 10*3/uL — ABNORMAL HIGH (ref 1.7–7.7)
Neutrophils Relative %: 69 %
Platelet Count: 532 10*3/uL — ABNORMAL HIGH (ref 150–400)
RBC: 4.19 MIL/uL (ref 3.87–5.11)
RDW: 13.4 % (ref 11.5–15.5)
WBC Count: 11.6 10*3/uL — ABNORMAL HIGH (ref 4.0–10.5)
nRBC: 0 % (ref 0.0–0.2)

## 2023-11-23 LAB — FERRITIN: Ferritin: 44 ng/mL (ref 11–307)

## 2023-11-23 LAB — VITAMIN B12: Vitamin B-12: 216 pg/mL (ref 180–914)

## 2023-11-23 LAB — FOLATE: Folate: 8.3 ng/mL (ref >5.9)

## 2023-11-23 MED ORDER — POTASSIUM CHLORIDE CRYS ER 20 MEQ PO TBCR: 20.0000 meq | EXTENDED_RELEASE_TABLET | Freq: Two times a day (BID) | ORAL | 0 refills | Status: AC

## 2023-11-23 NOTE — Telephone Encounter (Signed)
CRITICAL VALUE STICKER  CRITICAL VALUE:K= +2.7  RECEIVER (on-site recipient of call):Jerry Haugen, RN  DATE & TIME NOTIFIED: 11/23/2023  MESSENGER (representative from lab):Heather  MD NOTIFIED: Dr. Arbutus Ped  TIME OF NOTIFICATION:1315  RESPONSE:  Faxed result to PCP Dr. Neita Carp @ (f) [phone] and told pt to f/u with Dr Neita Carp this week.

## 2023-11-23 NOTE — Progress Notes (Signed)
Orders entered

## 2023-11-23 NOTE — Progress Notes (Signed)
[facility] [phone]   [phone]  CONSULT NOTE  REFERRING PHYSICIAN: Dr. Fara Chute  REASON FOR CONSULTATION:  33 years old African-American female with persistent anemia  HPI Daisy Werner is a 33 y.o. female came to the clinic today for initial evaluation accompanied by her mother.Discussed the use of AI scribe software for clinical note transcription with the patient, who gave verbal consent to proceed.  History of Present Illness   Daisy Werner, a 33 year old patient with a history of menorrhagia, asthma, hypertension, prediabetes, and pseudotumor cerebri, presents with concerns related to anemia. The patient first became aware of her anemia in 2013, when her primary care physician suggested she start taking iron pills. She took iron supplements regularly from July to October, but stopped after a gynecological procedure.  The patient had been experiencing heavy bleeding, initially thought to be related to her menstrual cycle. However, due to the severity of the bleeding, she sought care from a gynecologist. A D&C procedure was performed, and a form of birth control was inserted to help control the bleeding. During this time, cysts were discovered on both ovaries.  Post-procedure, the patient's bleeding has stopped, and she has not taken any iron supplements since October. She reports feeling tired and fatigued, with occasional headaches, but no dizziness. She denies any cravings for ice, chest pain, shortness of breath, or bleeding from other sites, except for a minor nosebleed attributed to a change in temperature.  The patient also mentions experiencing hip pain and a heavy feeling in her shoulder since her surgery. She has a family history of diabetes, hypertension, and breast cancer. She does not smoke, occasionally consumes alcohol, and denies any drug abuse. She is currently employed as a Sales promotion account executive. She is married  and has one daughter.      HPI  Past Medical History:  Diagnosis Date   Abnormal uterine bleeding (AUB)    Asthma    07-30-2023  per pt last used rescue 2 yrs ago,  currently does not have an inhaler   Benign intracranial hypertension    07-30-2023  per pt followed by pcp but has not had any issues past few years,  last seen by neurologist--- dr Vickey Huger 08-08-2016 note in epic   Family history of adverse reaction to anesthesia    mother--- ponv   Family history of cleft lip 03/26/2012   Paternal first cousin   History of trichomoniasis    Migraine without aura, not intractable    MIGRAINES D/T PSEUDOTUMOR CEREBRI   (07-30-2023  per pt no migraine for past couple yrs)   Pre-diabetes    Pseudotumor cerebri 07/2009   HAS HAD 2 SPINAL TAPS AND BLOOD PATCH IN 2010 &9/ 2011  (previouly followed by dr Vickey Huger, neuology)    Past Surgical History:  Procedure Laterality Date   HYSTEROSCOPY WITH D & C N/A 08/07/2023   Procedure: DILATATION AND CURETTAGE /HYSTEROSCOPY;  Surgeon: Osborn Coho, MD;  Location: [facility] OR;  Service: Gynecology;  Laterality: N/A;   NO PAST SURGERIES     WISDOM TOOTH EXTRACTION  2008    Family History  Problem Relation Age of Onset   Heart disease Paternal Grandmother        CHF   Stroke Paternal Grandmother    Other Maternal Aunt        VARICOSE VEINS   Other Maternal Grandmother        VARICOSE VEINS   Cancer Maternal Grandmother  BREAST TO BONES   Seizures Cousin        MATERANL;EPILEPSY   Seizures Maternal Aunt        MATERNAL   Migraines Maternal Aunt    Hypertension Mother    Hypertension Father    Diabetes Father        ORAL MEDS   Hypertension Maternal Aunt        2 AUNTS   Hypertension Maternal Uncle        1 UNCLE   Hypertension Paternal Aunt        4 AUNTS   Hypertension Paternal Uncle        1 UNCLE   Hypertension Brother    Diabetes Brother        IDDM   Drug abuse Maternal Uncle    Other Paternal Uncle         NICOTINE;MJ USE    Social History Social History   Tobacco Use   Smoking status: Former    Types: Cigarettes   Smokeless tobacco: Never   Tobacco comments:    07-30-2023  per pt quit smoking cigarette socially 2014,  started as teen  Vaping Use   Vaping status: Never Used  Substance Use Topics   Alcohol use: Yes    Comment: rare   Drug use: Never    No Known Allergies  Current Outpatient Medications  Medication Sig Dispense Refill   acetaminophen (TYLENOL) 500 MG tablet Take 500 mg by mouth every 6 (six) hours as needed. Back pain     amLODipine (NORVASC) 10 MG tablet Take 10 mg by mouth daily.     hydrochlorothiazide (HYDRODIURIL) 25 MG tablet Take 25 mg by mouth 2 (two) times daily.     ibuprofen (ADVIL) 600 MG tablet Take 1 tablet (600 mg total) by mouth every 6 (six) hours as needed for mild pain, moderate pain or cramping. 40 tablet 1   Iron Carbonyl-Vitamin C-FOS (CHEWABLE IRON PO) Take by mouth daily.     labetalol (NORMODYNE) 100 MG tablet Take 100 mg by mouth 2 (two) times daily.     oxyCODONE (ROXICODONE) 5 MG immediate release tablet Take 1 tablet (5 mg total) by mouth every 6 (six) hours as needed for severe pain, breakthrough pain or moderate pain. 10 tablet 0   No current facility-administered medications for this visit.    Review of Systems  Constitutional: positive for fatigue Eyes: negative Ears, nose, mouth, throat, and face: negative Respiratory: negative Cardiovascular: negative Gastrointestinal: negative Genitourinary:negative Integument/breast: negative Hematologic/lymphatic: negative Musculoskeletal:positive for arthralgias Neurological: negative Behavioral/Psych: negative Endocrine: negative Allergic/Immunologic: negative  Physical Exam  VHQ:IONGE, healthy, no distress, well nourished, well developed, and obese SKIN: skin color, texture, turgor are normal, no rashes or significant lesions HEAD: Normocephalic, No masses, lesions,  tenderness or abnormalities EYES: normal, PERRLA, Conjunctiva are pink and non-injected EARS: External ears normal, Canals clear OROPHARYNX:no exudate, no erythema, and lips, buccal mucosa, and tongue normal  NECK: supple, no adenopathy, no JVD LYMPH:  no palpable lymphadenopathy, no hepatosplenomegaly BREAST:not examined LUNGS: clear to auscultation , and palpation HEART: regular rate & rhythm, no murmurs, and no gallops ABDOMEN:abdomen soft, non-tender, obese, normal bowel sounds, and no masses or organomegaly BACK: Back symmetric, no curvature., No CVA tenderness EXTREMITIES:no joint deformities, effusion, or inflammation, no edema  NEURO: alert & oriented x 3 with fluent speech, no focal motor/sensory deficits  PERFORMANCE STATUS: ECOG 1  LABORATORY DATA: Lab Results  Component Value Date   WBC 9.6 08/07/2023  HGB 11.3 (L) 08/07/2023   HCT 33.8 (L) 08/07/2023   MCV 85.6 08/07/2023   PLT 486 (H) 08/07/2023      Chemistry      Component Value Date/Time   NA 140 08/07/2023 1109   K 3.1 (L) 08/07/2023 1109   CL 103 08/07/2023 1109   CO2 24 08/07/2023 1109   BUN 11 08/07/2023 1109   CREATININE 0.78 08/07/2023 1109      Component Value Date/Time   CALCIUM 9.3 08/07/2023 1109   ALKPHOS 25 (L) 08/07/2023 1109   AST 27 08/07/2023 1109   ALT 31 08/07/2023 1109   BILITOT 0.4 08/07/2023 1109       RADIOGRAPHIC STUDIES: No results found.  ASSESSMENT AND PLAN:     Anemia Chronic anemia since 2013. Current hemoglobin is 11.9 and hematocrit is 34.4, slightly below normal. Elevated WBC (11.6) and high platelet count (532,000) suggest iron deficiency anemia. Symptoms include fatigue, tiredness, and headaches. Minor epistaxis likely due to temperature changes. Differential diagnosis includes iron deficiency anemia; other causes are being investigated. Discussed potential need for iron infusion if iron deficiency is confirmed and severe. Iron supplementation should be taken with  vitamin C or orange juice to enhance absorption. Patient prefers to resume iron supplementation every other day. - Order iron studies, ferritin, vitamin B12, and folic acid levels - Resume iron supplementation every other day with vitamin C or orange juice - Consider iron infusion if iron deficiency is confirmed and severe - Follow-up in three months to reassess hemoglobin and hematocrit levels  Menorrhagia Abnormal uterine bleeding managed with D&C and birth control insertion. Presence of ovarian cysts on both ovaries. Bleeding has currently stopped. - Monitor for recurrence of abnormal bleeding - Follow-up with gynecologist as needed  Asthma Asthma, no acute symptoms discussed. - Continue current asthma management plan - Follow-up with primary care physician or pulmonologist as needed  Hypertension Hypertension, no specific details on current management or control discussed. - Continue current antihypertensive medications - Monitor blood pressure regularly - Follow-up with primary care physician for hypertension management  Prediabetes Prediabetes, no specific details on current management or control discussed. - Continue lifestyle modifications including diet and exercise - Monitor blood glucose levels regularly - Follow-up with primary care physician for diabetes management  Pseudotumor Cerebri Pseudotumor cerebri, no acute symptoms discussed. - Continue current management plan - Follow-up with neurologist as needed  General Health Maintenance Family history of diabetes, hypertension, and breast cancer. No smoking or drug abuse. Occasional alcohol use. No known medication allergies. - Encourage regular screenings for diabetes and hypertension - Encourage regular breast cancer screenings given family history - Promote healthy lifestyle choices including diet and exercise  Follow-up - Follow-up in three months to reassess hemoglobin and hematocrit levels - Contact patient  if any significant abnormalities are found in the blood work.     The patient voices understanding of current disease status and treatment options and is in agreement with the current care plan.  All questions were answered. The patient knows to call the clinic with any problems, questions or concerns. We can certainly see the patient much sooner if necessary.  Thank you so much for allowing me to participate in the care of Daisy Werner. I will continue to follow up the patient with you and assist in her care.  The total time spent in the appointment was 60 minutes.  Disclaimer: This note was dictated with voice recognition software. Similar sounding words can inadvertently be transcribed and may  not be corrected upon review.   Lajuana Matte November 23, 2023, 12:02 PM

## 2023-11-23 NOTE — Telephone Encounter (Signed)
Spoke with patient in regards to results.  Per Dr. Arbutus Ped- potassium is low. Sent in potassium chloride to pharmacy. Patient verbalized understanding.

## 2023-11-25 LAB — PROTEIN ELECTROPHORESIS, SERUM, WITH REFLEX
A/G Ratio: 0.9 (ref 0.7–1.7)
Albumin ELP: 3.6 g/dL (ref 2.9–4.4)
Alpha-1-Globulin: 0.3 g/dL (ref 0.0–0.4)
Alpha-2-Globulin: 0.8 g/dL (ref 0.4–1.0)
Beta Globulin: 1.3 g/dL (ref 0.7–1.3)
Gamma Globulin: 1.5 g/dL (ref 0.4–1.8)
Globulin, Total: 3.8 g/dL (ref 2.2–3.9)
Total Protein ELP: 7.4 g/dL (ref 6.0–8.5)

## 2024-02-17 NOTE — Progress Notes (Signed)
 [facility] OFFICE PROGRESS NOTE  [name], Ky Phillips, MD [address] [name] [address]  DIAGNOSIS: Mild anemia and thrombocytosis  PRIOR THERAPY: None  CURRENT THERAPY: Oral iron supplements with every other day.   INTERVAL HISTORY: Daisy [place] 33 y.o. female returns to the clinic today for a follow-up visit.  The patient was seen in the clinic initially in January 2025 for evaluation of mild anemia and thrombocytosis.  Per chart review she had vitamin studies including folate, iron, and B12.  Her iron was on the low end of normal and Dr. Marguerita Shih recommended that she resume taking iron supplement every other day which she has been taking and is compliant.  Her chart review she also had a hemoglobin electrophoresis in 2013 which was negative for hemoglobinopathy. Her SPEP was also normal.  Per chart review, Dr. Marguerita Shih feels that the patient's anemia is from heavy menstrual bleeding.  Her heavy menstrual bleeding has resolved at this time with birth control. She no longer has menstrual bleeding besides light spotting. Her B12 was on the low end of normal. She does not take a multivitamin. She is not sure if she is taking an additional B12 and will check when she gets home. She denies any unusual changes in her energy. Her energy "comes and goes". She denies craving of ice chips, chest pain, shortness of breath, lightheadedness, or any other abnormal bleeding or bruising.  Regarding the thrombocytosis, the patient denies any family history of bone marrow disorders.  She denies smoking.  She is here today for evaluation and repeat blood work.  MEDICAL HISTORY: Past Medical History:  Diagnosis Date   Abnormal uterine bleeding (AUB)    Asthma    07-30-2023  per pt last used rescue 2 yrs ago,  currently does not have an inhaler   Benign intracranial hypertension    07-30-2023  per pt followed by pcp but has not had any issues past few years,  last seen by neurologist--- dr  Albertina Hugger 08-08-2016 note in epic   Family history of adverse reaction to anesthesia    mother--- ponv   Family history of cleft lip 03/26/2012   Paternal first cousin   History of trichomoniasis    Migraine without aura, not intractable    MIGRAINES D/T PSEUDOTUMOR CEREBRI   (07-30-2023  per pt no migraine for past couple yrs)   Pre-diabetes    Pseudotumor cerebri 07/2009   HAS HAD 2 SPINAL TAPS AND BLOOD PATCH IN 2010 &9/ 2011  (previouly followed by dr dohmeier, neuology)    ALLERGIES:  has no known allergies.  MEDICATIONS:  Current Outpatient Medications  Medication Sig Dispense Refill   acetaminophen  (TYLENOL ) 500 MG tablet Take 500 mg by mouth every 6 (six) hours as needed. Back pain     amLODipine (NORVASC) 10 MG tablet Take 10 mg by mouth daily.     hydrochlorothiazide (HYDRODIURIL) 25 MG tablet Take 25 mg by mouth 2 (two) times daily.     ibuprofen  (ADVIL ) 600 MG tablet Take 1 tablet (600 mg total) by mouth every 6 (six) hours as needed for mild pain, moderate pain or cramping. 40 tablet 1   Iron Carbonyl-Vitamin C-FOS (CHEWABLE IRON PO) Take by mouth daily.     labetalol (NORMODYNE) 100 MG tablet Take 100 mg by mouth 2 (two) times daily.     oxyCODONE  (ROXICODONE ) 5 MG immediate release tablet Take 1 tablet (5 mg total) by mouth every 6 (six) hours  as needed for severe pain, breakthrough pain or moderate pain. 10 tablet 0   potassium chloride  SA (KLOR-CON  M) 20 MEQ tablet Take 1 tablet (20 mEq total) by mouth 2 (two) times daily. 20 tablet 0   No current facility-administered medications for this visit.    SURGICAL HISTORY:  Past Surgical History:  Procedure Laterality Date   HYSTEROSCOPY WITH D & C N/A 08/07/2023   Procedure: DILATATION AND CURETTAGE /HYSTEROSCOPY;  Surgeon: Renea Carrion, MD;  Location: [facility] OR;  Service: Gynecology;  Laterality: N/A;   NO PAST SURGERIES     WISDOM TOOTH EXTRACTION  2008    REVIEW OF SYSTEMS:   Review of Systems  Constitutional:  Positive for stable fatigue. Negative for appetite change, chills,  fever and unexpected weight change.  HENT: Negative for mouth sores, nosebleeds, sore throat and trouble swallowing.   Eyes: Negative for eye problems and icterus.  Respiratory: Negative for cough, hemoptysis, shortness of breath and wheezing.   Cardiovascular: Negative for chest pain and leg swelling.  Gastrointestinal: Negative for abdominal pain, constipation, diarrhea, nausea and vomiting.  Genitourinary: Negative for bladder incontinence, difficulty urinating, dysuria, frequency and hematuria.   Musculoskeletal: Negative for back pain, gait problem, neck pain and neck stiffness.  Skin: Negative for itching and rash.  Neurological: Negative for dizziness, extremity weakness, gait problem, headaches, light-headedness and seizures.  Hematological: Negative for adenopathy. Does not bruise/bleed easily.  Psychiatric/Behavioral: Negative for confusion, depression and sleep disturbance. The patient is not nervous/anxious.     PHYSICAL EXAMINATION:  There were no vitals taken for this visit.  ECOG PERFORMANCE STATUS: 0  Physical Exam  Constitutional: Oriented to person, place, and time and well-developed, well-nourished, and in no distress. HENT:  Head: Normocephalic and atraumatic.  Mouth/Throat: Oropharynx is clear and moist. No oropharyngeal exudate.  Eyes: Conjunctivae are normal. Right eye exhibits no discharge. Left eye exhibits no discharge. No scleral icterus.  Neck: Normal range of motion. Neck supple.  Cardiovascular: Normal rate, regular rhythm, normal heart sounds and intact distal pulses.   Pulmonary/Chest: Effort normal and breath sounds normal. No respiratory distress. No wheezes. No rales.  Abdominal: Soft. Bowel sounds are normal. Exhibits no distension and no mass. There is no tenderness.  Musculoskeletal: Normal range of motion. Exhibits no edema.  Lymphadenopathy:    No cervical adenopathy.   Neurological: Alert and oriented to person, place, and time. Exhibits normal muscle tone. Gait normal. Coordination normal.  Skin: Skin is warm and dry. No rash noted. Not diaphoretic. No erythema. No pallor.  Psychiatric: Mood, memory and judgment normal.  Vitals reviewed.  LABORATORY DATA: Lab Results  Component Value Date   WBC 11.6 (H) 11/23/2023   HGB 11.9 (L) 11/23/2023   HCT 34.9 (L) 11/23/2023   MCV 83.3 11/23/2023   PLT 532 (H) 11/23/2023      Chemistry      Component Value Date/Time   NA 137 11/23/2023 1155   K 2.7 (LL) 11/23/2023 1155   CL 101 11/23/2023 1155   CO2 28 11/23/2023 1155   BUN 11 11/23/2023 1155   CREATININE 0.79 11/23/2023 1155      Component Value Date/Time   CALCIUM 9.8 11/23/2023 1155   ALKPHOS 23 (L) 11/23/2023 1155   AST 23 11/23/2023 1155   ALT 24 11/23/2023 1155   BILITOT 0.4 11/23/2023 1155       RADIOGRAPHIC STUDIES:  No results found.   ASSESSMENT/PLAN:  This is a very pleasant 33 year old African-American female with chronic anemia since  at least 2013.  She also has thrombocytosis.  She has mild epistaxis and has previously had heavy menstrual bleeding.  The patient is on an iron supplement every other day and she is compliant  The patient had repeat labs today.  Labs were reviewed.  Her Hbg is table at 11.8. She has persistent but improved thrombocytosis with a platelet count of 471. I reviewed her labs with Dr. Marguerita Shih. It is felt to be reactive. We will hold off on running Jak2 testing at this time but can consider testing in the future if persistent thrombocytosis. Recommend that she continue taking her iron tablets. If her iron studies show significant iron deficiency, we may consider her for IV iron.   She will continue taking her iron supplement every other day with orange juice to help with the absorption.  We will see her back for labs and a follow-up visit in 4 months with repeat labs.  The patient was advised to call  immediately if she has any concerning symptoms in the interval. The patient voices understanding of current disease status and treatment options and is in agreement with the current care plan. All questions were answered. The patient knows to call the clinic with any problems, questions or concerns. We can certainly see the patient much sooner if necessary      No orders of the defined types were placed in this encounter.   The total time spent in the appointment was 20-29 minutes  Kerby Borner L Grafton Warzecha, PA-C 02/17/24

## 2024-02-22 ENCOUNTER — Inpatient Hospital Stay (HOSPITAL_BASED_OUTPATIENT_CLINIC_OR_DEPARTMENT_OTHER): Payer: BC Managed Care – PPO | Admitting: Physician Assistant

## 2024-02-22 ENCOUNTER — Inpatient Hospital Stay: Payer: BC Managed Care – PPO | Attending: Internal Medicine

## 2024-02-22 VITALS — BP 143/95 | HR 91 | Temp 98.0°F | Resp 16 | Wt 308.6 lb

## 2024-02-22 DIAGNOSIS — D509 Iron deficiency anemia, unspecified: Secondary | ICD-10-CM | POA: Diagnosis not present

## 2024-02-22 DIAGNOSIS — R040 Epistaxis: Secondary | ICD-10-CM | POA: Insufficient documentation

## 2024-02-22 DIAGNOSIS — D75839 Thrombocytosis, unspecified: Secondary | ICD-10-CM | POA: Diagnosis not present

## 2024-02-22 DIAGNOSIS — N920 Excessive and frequent menstruation with regular cycle: Secondary | ICD-10-CM | POA: Insufficient documentation

## 2024-02-22 DIAGNOSIS — D649 Anemia, unspecified: Secondary | ICD-10-CM | POA: Insufficient documentation

## 2024-02-22 DIAGNOSIS — D500 Iron deficiency anemia secondary to blood loss (chronic): Secondary | ICD-10-CM

## 2024-02-22 LAB — CBC WITH DIFFERENTIAL (CANCER CENTER ONLY)
Abs Immature Granulocytes: 0.03 10*3/uL (ref 0.00–0.07)
Basophils Absolute: 0.1 10*3/uL (ref 0.0–0.1)
Basophils Relative: 1 %
Eosinophils Absolute: 0.2 10*3/uL (ref 0.0–0.5)
Eosinophils Relative: 2 %
HCT: 34.3 % — ABNORMAL LOW (ref 36.0–46.0)
Hemoglobin: 11.8 g/dL — ABNORMAL LOW (ref 12.0–15.0)
Immature Granulocytes: 0 %
Lymphocytes Relative: 24 %
Lymphs Abs: 2.4 10*3/uL (ref 0.7–4.0)
MCH: 28.9 pg (ref 26.0–34.0)
MCHC: 34.4 g/dL (ref 30.0–36.0)
MCV: 84.1 fL (ref 80.0–100.0)
Monocytes Absolute: 0.5 10*3/uL (ref 0.1–1.0)
Monocytes Relative: 5 %
Neutro Abs: 6.9 10*3/uL (ref 1.7–7.7)
Neutrophils Relative %: 68 %
Platelet Count: 471 10*3/uL — ABNORMAL HIGH (ref 150–400)
RBC: 4.08 MIL/uL (ref 3.87–5.11)
RDW: 12.8 % (ref 11.5–15.5)
WBC Count: 10.2 10*3/uL (ref 4.0–10.5)
nRBC: 0 % (ref 0.0–0.2)

## 2024-02-22 LAB — IRON AND IRON BINDING CAPACITY (CC-WL,HP ONLY)
Iron: 52 ug/dL (ref 28–170)
Saturation Ratios: 17 % (ref 10.4–31.8)
TIBC: 314 ug/dL (ref 250–450)
UIBC: 262 ug/dL (ref 148–442)

## 2024-02-22 LAB — FERRITIN: Ferritin: 74 ng/mL (ref 11–307)

## 2024-03-18 ENCOUNTER — Telehealth: Payer: Self-pay | Admitting: Physician Assistant

## 2024-03-18 NOTE — Progress Notes (Deleted)
 [facility] OFFICE PROGRESS NOTE  [name], Ky Phillips, MD [address] [name] [address]  DIAGNOSIS: Mild anemia and thrombocytosis   PRIOR THERAPY: None  CURRENT THERAPY: Oral iron supplements with every other day.    INTERVAL HISTORY: Daisy [place] 33 y.o. female returns  to the clinic today for a follow-up visit.  The patient was seen in the clinic in April 2025. She is seen for thrombocytosis and anemia. she had vitamin studies including folate, iron, and B12.  Her iron was on the low end of normal and Dr. Marguerita Shih recommended that she resume taking iron supplement every other day which she has been taking and is compliant.  Her chart review she also had a hemoglobin electrophoresis in 2013 which was negative for hemoglobinopathy. Her SPEP was also normal.   Dr. Marguerita Shih feels that her anemia is from heavy menstrual bleeding.  Her heavy menstrual bleeding has resolved at this time with birth control. She no longer has menstrual bleeding besides light spotting. Her B12 was on the low end of normal and she is currently taking ***.   he denies any unusual changes in her energy. Her energy "comes and goes". She denies craving of ice chips, chest pain, shortness of breath, lightheadedness, or any other abnormal bleeding or bruising.  Regarding the thrombocytosis, the patient denies any family history of bone marrow disorders.  She denies smoking.  She is here today for evaluation and repeat blood work.   MEDICAL HISTORY: Past Medical History:  Diagnosis Date   Abnormal uterine bleeding (AUB)    Asthma    07-30-2023  per pt last used rescue 2 yrs ago,  currently does not have an inhaler   Benign intracranial hypertension    07-30-2023  per pt followed by pcp but has not had any issues past few years,  last seen by neurologist--- dr Albertina Hugger 08-08-2016 note in epic   Family history of adverse reaction to anesthesia    mother--- ponv   Family history of cleft lip 03/26/2012    Paternal first cousin   History of trichomoniasis    Migraine without aura, not intractable    MIGRAINES D/T PSEUDOTUMOR CEREBRI   (07-30-2023  per pt no migraine for past couple yrs)   Pre-diabetes    Pseudotumor cerebri 07/2009   HAS HAD 2 SPINAL TAPS AND BLOOD PATCH IN 2010 &9/ 2011  (previouly followed by dr dohmeier, neuology)    ALLERGIES:  has no known allergies.  MEDICATIONS:  Current Outpatient Medications  Medication Sig Dispense Refill   acetaminophen  (TYLENOL ) 500 MG tablet Take 500 mg by mouth every 6 (six) hours as needed. Back pain     amLODipine (NORVASC) 10 MG tablet Take 10 mg by mouth daily.     hydrochlorothiazide (HYDRODIURIL) 25 MG tablet Take 25 mg by mouth 2 (two) times daily.     ibuprofen  (ADVIL ) 600 MG tablet Take 1 tablet (600 mg total) by mouth every 6 (six) hours as needed for mild pain, moderate pain or cramping. 40 tablet 1   Iron Carbonyl-Vitamin C-FOS (CHEWABLE IRON PO) Take by mouth daily.     labetalol (NORMODYNE) 100 MG tablet Take 100 mg by mouth 2 (two) times daily.     oxyCODONE  (ROXICODONE ) 5 MG immediate release tablet Take 1 tablet (5 mg total) by mouth every 6 (six) hours as needed for severe pain, breakthrough pain or moderate pain. 10 tablet 0   potassium chloride  SA (KLOR-CON  M)  20 MEQ tablet Take 1 tablet (20 mEq total) by mouth 2 (two) times daily. 20 tablet 0   No current facility-administered medications for this visit.    SURGICAL HISTORY:  Past Surgical History:  Procedure Laterality Date   HYSTEROSCOPY WITH D & C N/A 08/07/2023   Procedure: DILATATION AND CURETTAGE /HYSTEROSCOPY;  Surgeon: Renea Carrion, MD;  Location: [facility] OR;  Service: Gynecology;  Laterality: N/A;   NO PAST SURGERIES     WISDOM TOOTH EXTRACTION  2008    REVIEW OF SYSTEMS:   Review of Systems  Constitutional: Negative for appetite change, chills, fatigue, fever and unexpected weight change.  HENT:   Negative for mouth sores, nosebleeds, sore throat and  trouble swallowing.   Eyes: Negative for eye problems and icterus.  Respiratory: Negative for cough, hemoptysis, shortness of breath and wheezing.   Cardiovascular: Negative for chest pain and leg swelling.  Gastrointestinal: Negative for abdominal pain, constipation, diarrhea, nausea and vomiting.  Genitourinary: Negative for bladder incontinence, difficulty urinating, dysuria, frequency and hematuria.   Musculoskeletal: Negative for back pain, gait problem, neck pain and neck stiffness.  Skin: Negative for itching and rash.  Neurological: Negative for dizziness, extremity weakness, gait problem, headaches, light-headedness and seizures.  Hematological: Negative for adenopathy. Does not bruise/bleed easily.  Psychiatric/Behavioral: Negative for confusion, depression and sleep disturbance. The patient is not nervous/anxious.     PHYSICAL EXAMINATION:  There were no vitals taken for this visit.  ECOG PERFORMANCE STATUS: {CHL ONC ECOG [id number]  Physical Exam  Constitutional: Oriented to person, place, and time and well-developed, well-nourished, and in no distress. No distress.  HENT:  Head: Normocephalic and atraumatic.  Mouth/Throat: Oropharynx is clear and moist. No oropharyngeal exudate.  Eyes: Conjunctivae are normal. Right eye exhibits no discharge. Left eye exhibits no discharge. No scleral icterus.  Neck: Normal range of motion. Neck supple.  Cardiovascular: Normal rate, regular rhythm, normal heart sounds and intact distal pulses.   Pulmonary/Chest: Effort normal and breath sounds normal. No respiratory distress. No wheezes. No rales.  Abdominal: Soft. Bowel sounds are normal. Exhibits no distension and no mass. There is no tenderness.  Musculoskeletal: Normal range of motion. Exhibits no edema.  Lymphadenopathy:    No cervical adenopathy.  Neurological: Alert and oriented to person, place, and time. Exhibits normal muscle tone. Gait normal. Coordination normal.  Skin:  Skin is warm and dry. No rash noted. Not diaphoretic. No erythema. No pallor.  Psychiatric: Mood, memory and judgment normal.  Vitals reviewed.  LABORATORY DATA: Lab Results  Component Value Date   WBC 10.2 02/22/2024   HGB 11.8 (L) 02/22/2024   HCT 34.3 (L) 02/22/2024   MCV 84.1 02/22/2024   PLT 471 (H) 02/22/2024      Chemistry      Component Value Date/Time   NA 137 11/23/2023 1155   K 2.7 (LL) 11/23/2023 1155   CL 101 11/23/2023 1155   CO2 28 11/23/2023 1155   BUN 11 11/23/2023 1155   CREATININE 0.79 11/23/2023 1155      Component Value Date/Time   CALCIUM 9.8 11/23/2023 1155   ALKPHOS 23 (L) 11/23/2023 1155   AST 23 11/23/2023 1155   ALT 24 11/23/2023 1155   BILITOT 0.4 11/23/2023 1155       RADIOGRAPHIC STUDIES:  No results found.   ASSESSMENT/PLAN:  No problem-specific Assessment & Plan notes found for this encounter.   No orders of the defined types were placed in this encounter.    I spent {CHL  ONC TIME VISIT - BJYNW:[id number]} counseling the patient face to face. The total time spent in the appointment was {CHL ONC TIME VISIT - VHQIO:[id number]}.  Daisy Behunin L Zachari Alberta, PA-C 03/18/24

## 2024-03-18 NOTE — Telephone Encounter (Signed)
 Left the patient a voicemail with rescheduled appointment details. ( Cassie accidentally put 4 weeks instead of 4 months in LOS). I explained the mix up and to call us  if she had any questions.

## 2024-03-24 ENCOUNTER — Inpatient Hospital Stay

## 2024-03-24 ENCOUNTER — Inpatient Hospital Stay: Admitting: Physician Assistant

## 2024-06-08 NOTE — Progress Notes (Signed)
 DS COLLECTED.

## 2024-06-19 ENCOUNTER — Other Ambulatory Visit: Payer: Self-pay | Admitting: Physician Assistant

## 2024-06-19 DIAGNOSIS — D509 Iron deficiency anemia, unspecified: Secondary | ICD-10-CM

## 2024-06-19 NOTE — Progress Notes (Unsigned)
 [facility] OFFICE PROGRESS NOTE  [name], [name], MD [address] [name] [address]  DIAGNOSIS:  Mild anemia and thrombocytosis   PRIOR THERAPY: None  CURRENT THERAPY: Oral iron supplements with every other day.  ***Ask about B12  INTERVAL HISTORY: Daisy Werner 33 y.o. female returns to the clinic today for a follow-up visit.  The patient was seen in the clinic initially in January 2025 for evaluation of mild anemia and thrombocytosis.  Per chart review she had vitamin studies including folate, iron, and B12.  Her iron was on the low end of normal and Dr. Sherrod recommended that she resume taking iron supplement every other day which she has been taking and is compliant.  Her chart review she also had a hemoglobin electrophoresis in 2013 which was negative for hemoglobinopathy. Her SPEP was also normal.  She was last seen in the clinic by myself in April 2025.    Per chart review, Dr. Sherrod feels that the patient's anemia is from heavy menstrual bleeding.  Her heavy menstrual bleeding has resolved at this time with birth control. She no longer has menstrual bleeding besides light spotting. Her B12 was on the low end of normal. She does not take a multivitamin. She is not sure if she is taking an additional B12 and will check when she gets home.***At her last appointment we discussed ***   She denies any unusual changes in her energy. Her energy comes and goes. She denies craving of ice chips, chest pain, shortness of breath, lightheadedness, or any other abnormal bleeding or bruising.  Regarding the thrombocytosis, the patient denies any family history of bone marrow disorders.  She denies smoking.  She is here today for evaluation and repeat blood work.    MEDICAL HISTORY: Past Medical History:  Diagnosis Date   Abnormal uterine bleeding (AUB)    Asthma    07-30-2023  per pt last used rescue 2 yrs ago,  currently does not have an inhaler   Benign  intracranial hypertension    07-30-2023  per pt followed by pcp but has not had any issues past few years,  last seen by neurologist--- dr chalice 08-08-2016 note in epic   Family history of adverse reaction to anesthesia    mother--- ponv   Family history of cleft lip 03/26/2012   Paternal first cousin   History of trichomoniasis    Migraine without aura, not intractable    MIGRAINES D/T PSEUDOTUMOR CEREBRI   (07-30-2023  per pt no migraine for past couple yrs)   Pre-diabetes    Pseudotumor cerebri 07/2009   HAS HAD 2 SPINAL TAPS AND BLOOD PATCH IN 2010 &9/ 2011  (previouly followed by dr dohmeier, neuology)    ALLERGIES:  has no known allergies.  MEDICATIONS:  Current Outpatient Medications  Medication Sig Dispense Refill   acetaminophen  (TYLENOL ) 500 MG tablet Take 500 mg by mouth every 6 (six) hours as needed. Back pain     amLODipine (NORVASC) 10 MG tablet Take 10 mg by mouth daily.     hydrochlorothiazide (HYDRODIURIL) 25 MG tablet Take 25 mg by mouth 2 (two) times daily.     ibuprofen  (ADVIL ) 600 MG tablet Take 1 tablet (600 mg total) by mouth every 6 (six) hours as needed for mild pain, moderate pain or cramping. 40 tablet 1   Iron Carbonyl-Vitamin C-FOS (CHEWABLE IRON PO) Take by mouth daily.     labetalol (NORMODYNE) 100 MG tablet Take 100  mg by mouth 2 (two) times daily.     oxyCODONE  (ROXICODONE ) 5 MG immediate release tablet Take 1 tablet (5 mg total) by mouth every 6 (six) hours as needed for severe pain, breakthrough pain or moderate pain. 10 tablet 0   potassium chloride  SA (KLOR-CON  M) 20 MEQ tablet Take 1 tablet (20 mEq total) by mouth 2 (two) times daily. 20 tablet 0   No current facility-administered medications for this visit.    SURGICAL HISTORY:  Past Surgical History:  Procedure Laterality Date   HYSTEROSCOPY WITH D & C N/A 08/07/2023   Procedure: DILATATION AND CURETTAGE /HYSTEROSCOPY;  Surgeon: Henry Slough, MD;  Location: [facility] OR;  Service: Gynecology;   Laterality: N/A;   NO PAST SURGERIES     WISDOM TOOTH EXTRACTION  2008    REVIEW OF SYSTEMS:   Review of Systems  Constitutional: Negative for appetite change, chills, fatigue, fever and unexpected weight change.  HENT:   Negative for mouth sores, nosebleeds, sore throat and trouble swallowing.   Eyes: Negative for eye problems and icterus.  Respiratory: Negative for cough, hemoptysis, shortness of breath and wheezing.   Cardiovascular: Negative for chest pain and leg swelling.  Gastrointestinal: Negative for abdominal pain, constipation, diarrhea, nausea and vomiting.  Genitourinary: Negative for bladder incontinence, difficulty urinating, dysuria, frequency and hematuria.   Musculoskeletal: Negative for back pain, gait problem, neck pain and neck stiffness.  Skin: Negative for itching and rash.  Neurological: Negative for dizziness, extremity weakness, gait problem, headaches, light-headedness and seizures.  Hematological: Negative for adenopathy. Does not bruise/bleed easily.  Psychiatric/Behavioral: Negative for confusion, depression and sleep disturbance. The patient is not nervous/anxious.     PHYSICAL EXAMINATION:  There were no vitals taken for this visit.  ECOG PERFORMANCE STATUS: {CHL ONC ECOG [id number]  Physical Exam  Constitutional: Oriented to person, place, and time and well-developed, well-nourished, and in no distress. No distress.  HENT:  Head: Normocephalic and atraumatic.  Mouth/Throat: Oropharynx is clear and moist. No oropharyngeal exudate.  Eyes: Conjunctivae are normal. Right eye exhibits no discharge. Left eye exhibits no discharge. No scleral icterus.  Neck: Normal range of motion. Neck supple.  Cardiovascular: Normal rate, regular rhythm, normal heart sounds and intact distal pulses.   Pulmonary/Chest: Effort normal and breath sounds normal. No respiratory distress. No wheezes. No rales.  Abdominal: Soft. Bowel sounds are normal. Exhibits no  distension and no mass. There is no tenderness.  Musculoskeletal: Normal range of motion. Exhibits no edema.  Lymphadenopathy:    No cervical adenopathy.  Neurological: Alert and oriented to person, place, and time. Exhibits normal muscle tone. Gait normal. Coordination normal.  Skin: Skin is warm and dry. No rash noted. Not diaphoretic. No erythema. No pallor.  Psychiatric: Mood, memory and judgment normal.  Vitals reviewed.  LABORATORY DATA: Lab Results  Component Value Date   WBC 10.2 02/22/2024   HGB 11.8 (L) 02/22/2024   HCT 34.3 (L) 02/22/2024   MCV 84.1 02/22/2024   PLT 471 (H) 02/22/2024      Chemistry      Component Value Date/Time   NA 137 11/23/2023 1155   K 2.7 (LL) 11/23/2023 1155   CL 101 11/23/2023 1155   CO2 28 11/23/2023 1155   BUN 11 11/23/2023 1155   CREATININE 0.79 11/23/2023 1155      Component Value Date/Time   CALCIUM 9.8 11/23/2023 1155   ALKPHOS 23 (L) 11/23/2023 1155   AST 23 11/23/2023 1155   ALT 24 11/23/2023 1155  BILITOT 0.4 11/23/2023 1155       RADIOGRAPHIC STUDIES:  No results found.   ASSESSMENT/PLAN:  This is a very pleasant 33 year old African-American female with chronic anemia since at least 2013.  She also has thrombocytosis.  She has mild epistaxis and has previously had heavy menstrual bleeding.   The patient is on an iron supplement every other day and she is compliant   The patient had repeat labs today.  Labs were reviewed.  Her Hbg is table at ***. She has persistent but improved thrombocytosis with a platelet count of ***. I reviewed her labs with Dr. Sherrod. It is felt to be reactive. We will hold off on running Jak2 testing at this time but can consider testing in the future if persistent thrombocytosis. Recommend that she continue taking her iron tablets. If her iron studies show significant iron deficiency, we may consider her for IV iron.    She will continue taking her iron supplement every other day with orange  juice to help with the absorption.   We will see her back for labs and a follow-up visit in 4 months with repeat labs.  The patient was advised to call immediately if she has any concerning symptoms in the interval. The patient voices understanding of current disease status and treatment options and is in agreement with the current care plan. All questions were answered. The patient knows to call the clinic with any problems, questions or concerns. We can certainly see the patient much sooner if necessary    No orders of the defined types were placed in this encounter.    I spent {CHL ONC TIME VISIT - DTPQU:[id number]} counseling the patient face to face. The total time spent in the appointment was {CHL ONC TIME VISIT - DTPQU:[id number]}.  Carroll Lingelbach L Quorra Rosene, PA-C 06/19/24

## 2024-06-23 ENCOUNTER — Encounter: Payer: Self-pay | Admitting: Physician Assistant

## 2024-06-23 ENCOUNTER — Inpatient Hospital Stay: Attending: Physician Assistant

## 2024-06-23 ENCOUNTER — Inpatient Hospital Stay (HOSPITAL_BASED_OUTPATIENT_CLINIC_OR_DEPARTMENT_OTHER): Admitting: Physician Assistant

## 2024-06-23 ENCOUNTER — Telehealth: Payer: Self-pay | Admitting: Physician Assistant

## 2024-06-23 VITALS — BP 139/88 | HR 88 | Temp 97.9°F | Resp 18 | Ht 65.0 in | Wt 321.0 lb

## 2024-06-23 DIAGNOSIS — D509 Iron deficiency anemia, unspecified: Secondary | ICD-10-CM | POA: Diagnosis not present

## 2024-06-23 DIAGNOSIS — D75839 Thrombocytosis, unspecified: Secondary | ICD-10-CM | POA: Diagnosis not present

## 2024-06-23 DIAGNOSIS — R040 Epistaxis: Secondary | ICD-10-CM | POA: Insufficient documentation

## 2024-06-23 DIAGNOSIS — D649 Anemia, unspecified: Secondary | ICD-10-CM | POA: Insufficient documentation

## 2024-06-23 LAB — CBC WITH DIFFERENTIAL (CANCER CENTER ONLY)
Abs Immature Granulocytes: 0.03 K/uL (ref 0.00–0.07)
Basophils Absolute: 0.1 K/uL (ref 0.0–0.1)
Basophils Relative: 1 %
Eosinophils Absolute: 0.2 K/uL (ref 0.0–0.5)
Eosinophils Relative: 2 %
HCT: 34.8 % — ABNORMAL LOW (ref 36.0–46.0)
Hemoglobin: 12 g/dL (ref 12.0–15.0)
Immature Granulocytes: 0 %
Lymphocytes Relative: 22 %
Lymphs Abs: 2.2 K/uL (ref 0.7–4.0)
MCH: 28.8 pg (ref 26.0–34.0)
MCHC: 34.5 g/dL (ref 30.0–36.0)
MCV: 83.5 fL (ref 80.0–100.0)
Monocytes Absolute: 0.6 K/uL (ref 0.1–1.0)
Monocytes Relative: 7 %
Neutro Abs: 6.6 K/uL (ref 1.7–7.7)
Neutrophils Relative %: 68 %
Platelet Count: 474 K/uL — ABNORMAL HIGH (ref 150–400)
RBC: 4.17 MIL/uL (ref 3.87–5.11)
RDW: 12.7 % (ref 11.5–15.5)
WBC Count: 9.8 K/uL (ref 4.0–10.5)
nRBC: 0 % (ref 0.0–0.2)

## 2024-06-23 LAB — IRON AND IRON BINDING CAPACITY (CC-WL,HP ONLY)
Iron: 111 ug/dL (ref 28–170)
Saturation Ratios: 38 % — ABNORMAL HIGH (ref 10.4–31.8)
TIBC: 290 ug/dL (ref 250–450)
UIBC: 179 ug/dL (ref 148–442)

## 2024-06-23 LAB — VITAMIN B12: Vitamin B-12: 499 pg/mL (ref 180–914)

## 2024-06-23 LAB — FERRITIN: Ferritin: 98 ng/mL (ref 11–307)

## 2024-06-23 NOTE — Telephone Encounter (Signed)
 Scheduled appointments per 8/28 los. Talked with the patient and she is aware of the made appointments.

## 2024-12-22 ENCOUNTER — Other Ambulatory Visit

## 2024-12-22 ENCOUNTER — Ambulatory Visit: Admitting: Physician Assistant
